# Patient Record
Sex: Female | Born: 1950 | Race: White | Hispanic: No | Marital: Single | Smoking: Never smoker
Health system: Southern US, Community
[De-identification: ages and names within clinical notes are randomized; demographics above are authoritative.]

## PROBLEM LIST (undated history)

## (undated) DIAGNOSIS — H269 Unspecified cataract: Secondary | ICD-10-CM

## (undated) DIAGNOSIS — F419 Anxiety disorder, unspecified: Secondary | ICD-10-CM

## (undated) DIAGNOSIS — M8580 Other specified disorders of bone density and structure, unspecified site: Secondary | ICD-10-CM

## (undated) DIAGNOSIS — J45909 Unspecified asthma, uncomplicated: Secondary | ICD-10-CM

## (undated) DIAGNOSIS — T7840XA Allergy, unspecified, initial encounter: Secondary | ICD-10-CM

## (undated) HISTORY — DX: Unspecified cataract: H26.9

## (undated) HISTORY — DX: Allergy, unspecified, initial encounter: T78.40XA

## (undated) HISTORY — PX: ABDOMINAL HYSTERECTOMY: SHX81

## (undated) HISTORY — PX: TUBAL LIGATION: SHX77

## (undated) HISTORY — DX: Other specified disorders of bone density and structure, unspecified site: M85.80

## (undated) HISTORY — PX: BLADDER SURGERY: SHX569

---

## 1997-06-25 ENCOUNTER — Other Ambulatory Visit: Admission: RE | Admit: 1997-06-25 | Discharge: 1997-06-25 | Payer: Self-pay | Admitting: Obstetrics and Gynecology

## 1998-07-18 ENCOUNTER — Other Ambulatory Visit: Admission: RE | Admit: 1998-07-18 | Discharge: 1998-07-18 | Payer: Self-pay | Admitting: Obstetrics and Gynecology

## 1998-07-26 ENCOUNTER — Other Ambulatory Visit: Admission: RE | Admit: 1998-07-26 | Discharge: 1998-07-26 | Payer: Self-pay | Admitting: Gastroenterology

## 1999-07-19 ENCOUNTER — Other Ambulatory Visit: Admission: RE | Admit: 1999-07-19 | Discharge: 1999-07-19 | Payer: Self-pay | Admitting: Obstetrics and Gynecology

## 1999-10-04 ENCOUNTER — Observation Stay (HOSPITAL_COMMUNITY): Admission: RE | Admit: 1999-10-04 | Discharge: 1999-10-05 | Payer: Self-pay | Admitting: Urology

## 2000-07-30 ENCOUNTER — Other Ambulatory Visit: Admission: RE | Admit: 2000-07-30 | Discharge: 2000-07-30 | Payer: Self-pay | Admitting: Obstetrics and Gynecology

## 2002-05-07 ENCOUNTER — Other Ambulatory Visit (HOSPITAL_COMMUNITY): Admission: RE | Admit: 2002-05-07 | Discharge: 2002-05-21 | Payer: Self-pay | Admitting: Psychiatry

## 2004-11-07 ENCOUNTER — Emergency Department (HOSPITAL_COMMUNITY): Admission: EM | Admit: 2004-11-07 | Discharge: 2004-11-08 | Payer: Self-pay | Admitting: Emergency Medicine

## 2007-02-10 ENCOUNTER — Ambulatory Visit: Payer: Self-pay | Admitting: Internal Medicine

## 2007-02-21 ENCOUNTER — Ambulatory Visit: Payer: Self-pay | Admitting: Internal Medicine

## 2008-05-31 ENCOUNTER — Encounter: Admission: RE | Admit: 2008-05-31 | Discharge: 2008-05-31 | Payer: Self-pay | Admitting: Family Medicine

## 2009-01-26 ENCOUNTER — Encounter: Admission: RE | Admit: 2009-01-26 | Discharge: 2009-01-26 | Payer: Self-pay | Admitting: Unknown Physician Specialty

## 2010-04-04 IMAGING — CT CT PELVIS W/O CM
2 of 4 series · 17 of 46 positions shown, 19 images · non-contrast
Comparison: None.

CT ABDOMEN

CLINICAL DATA: Right lower quadrant pain and hematuria.  Bladder
surgery 10 years ago.

CT OF THE ABDOMEN AND PELVIS WITHOUT CONTRAST (CT UROGRAM)
TECHNIQUE: Multidetector CT imaging was performed through the
abdomen and pelvis to include the urinary tract.

[Series 2: wo · axial · 0.68mm/px · z∈[+1117,+1502]mm · 14 of 85 slices shown, 16 images]
[im 4/85  soft-tissue]
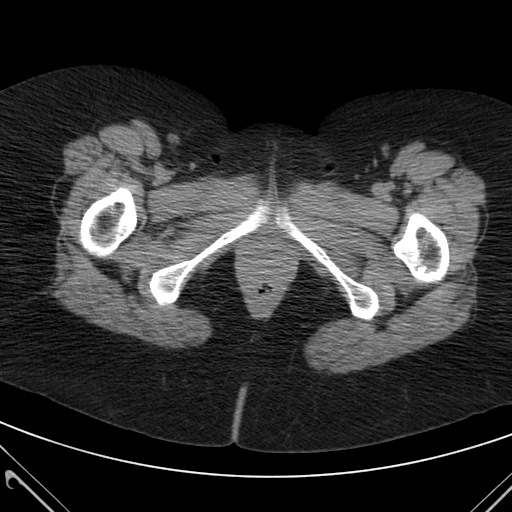
[im 4/85  bone]
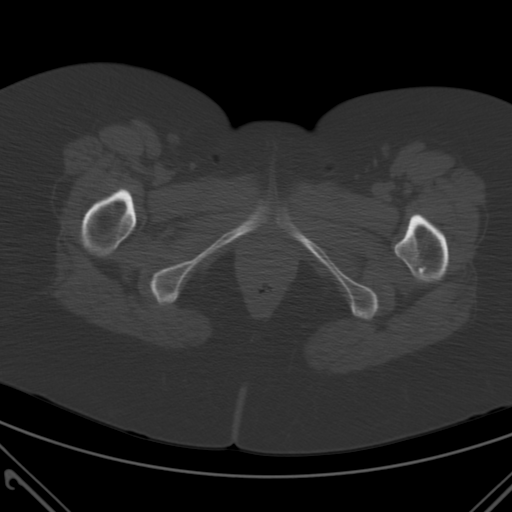
[im 11/85  soft-tissue]
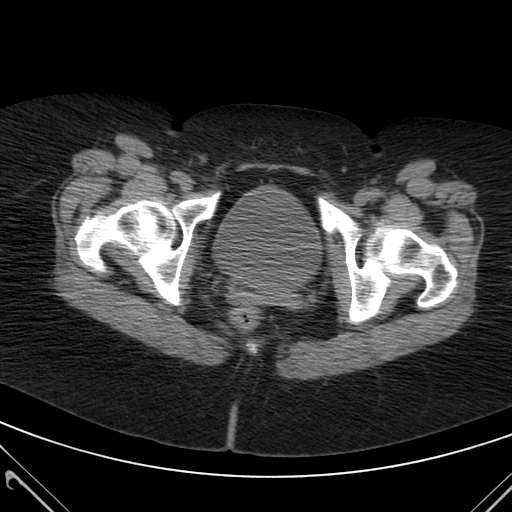
[im 18/85  soft-tissue]
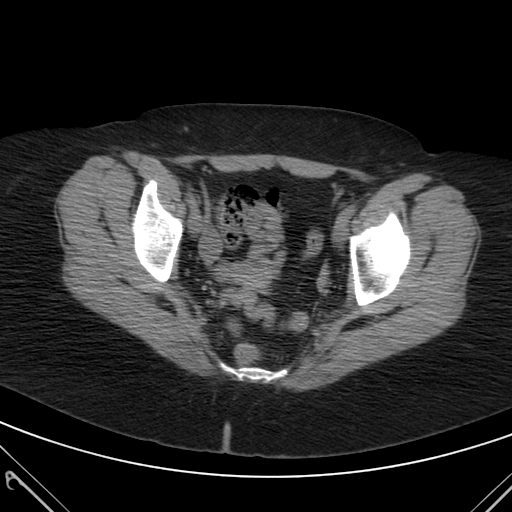
[im 22/85  soft-tissue]
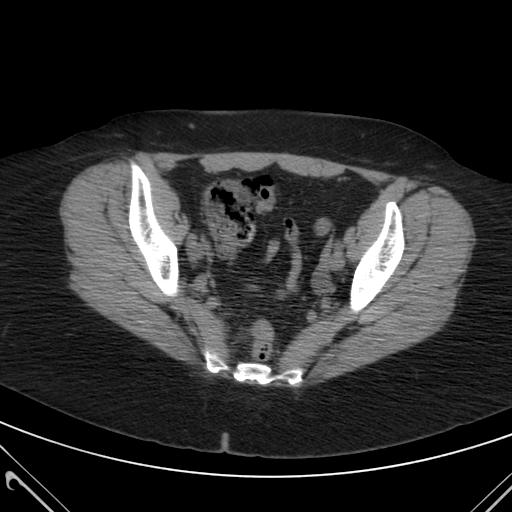
[im 29/85  soft-tissue]
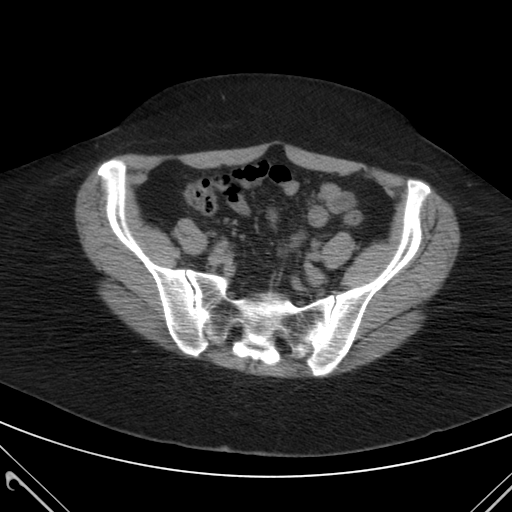
[im 36/85  soft-tissue]
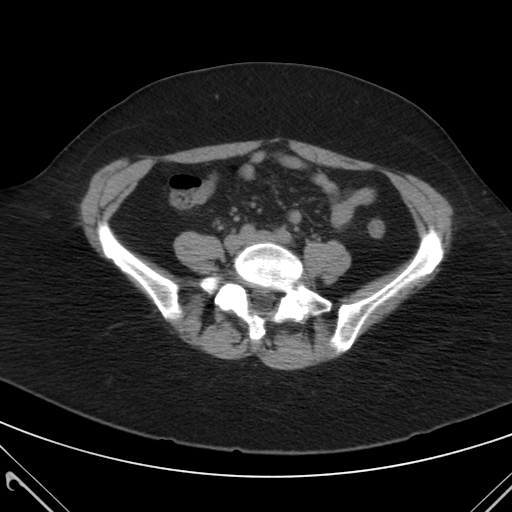
[im 39/85  soft-tissue]
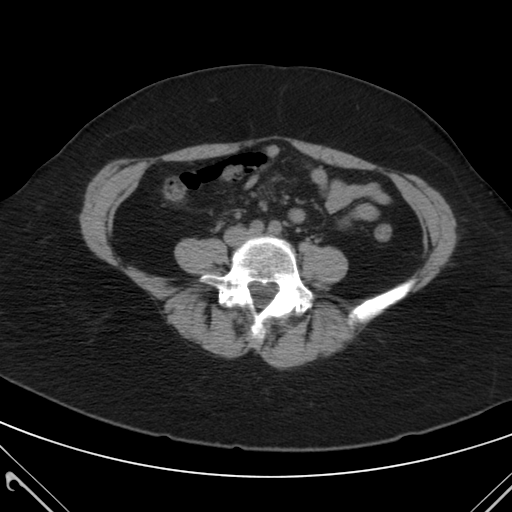
[im 46/85  soft-tissue]
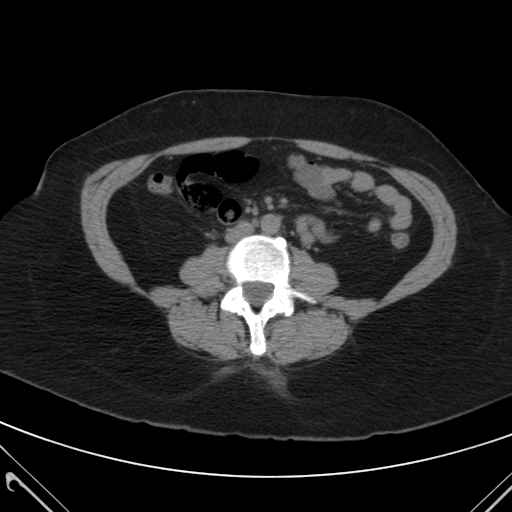
[im 50/85  soft-tissue]
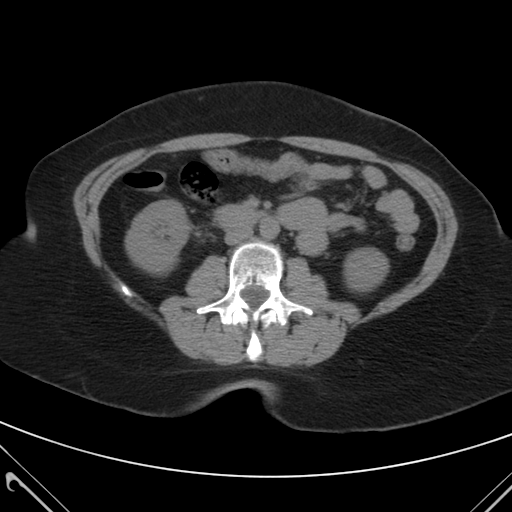
[im 50/85  bone]
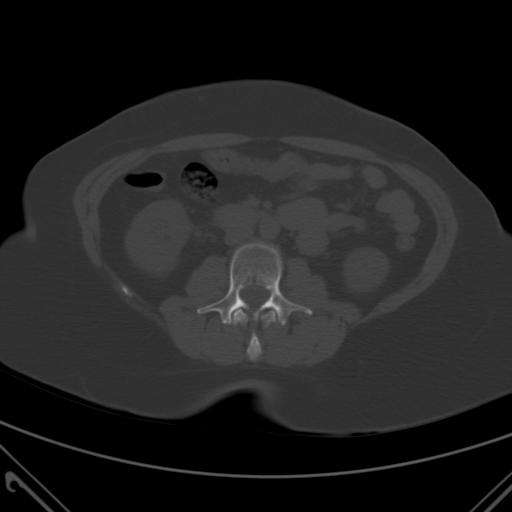
[im 57/85  soft-tissue]
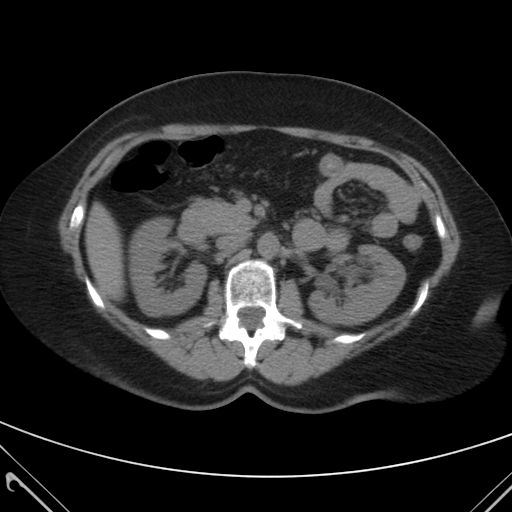
[im 64/85  soft-tissue]
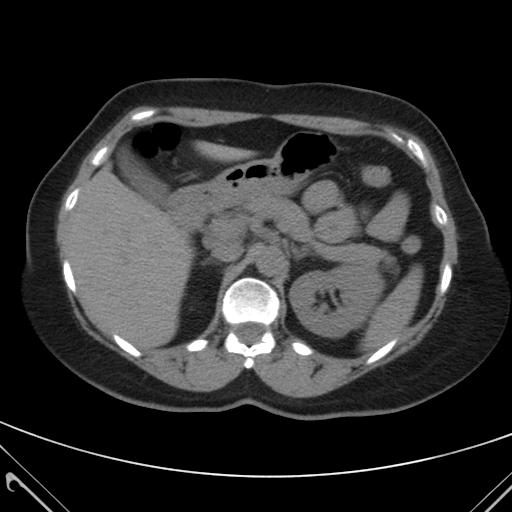
[im 67/85  soft-tissue]
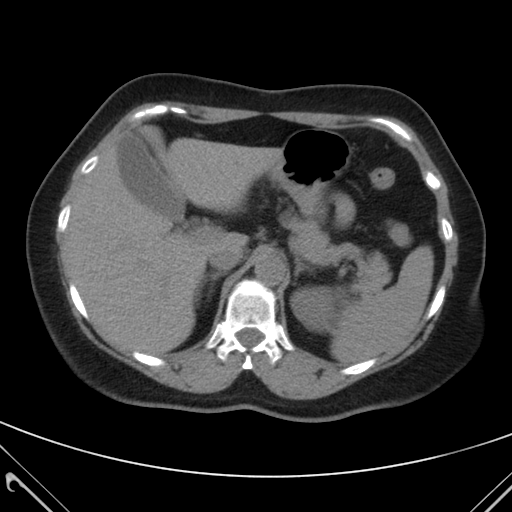
[im 74/85  soft-tissue]
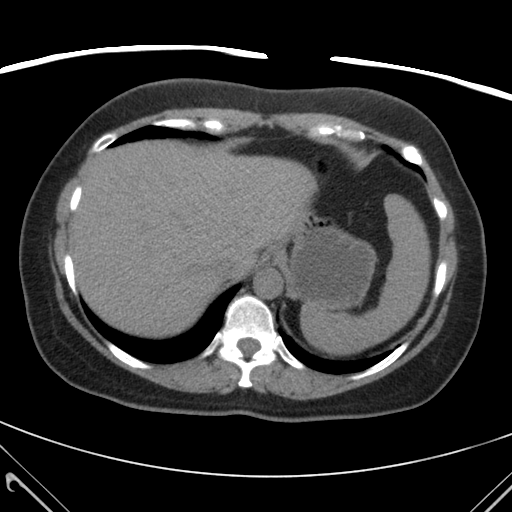
[im 81/85  soft-tissue]
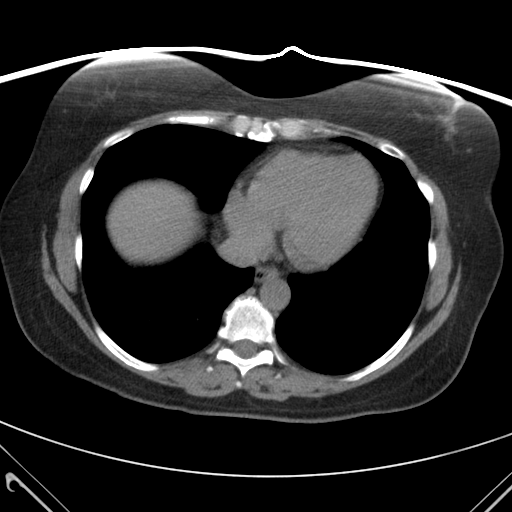

[coronals · coronal · 0.82mm/px · 3 of 83 slices shown]
[im 28/83  soft-tissue]
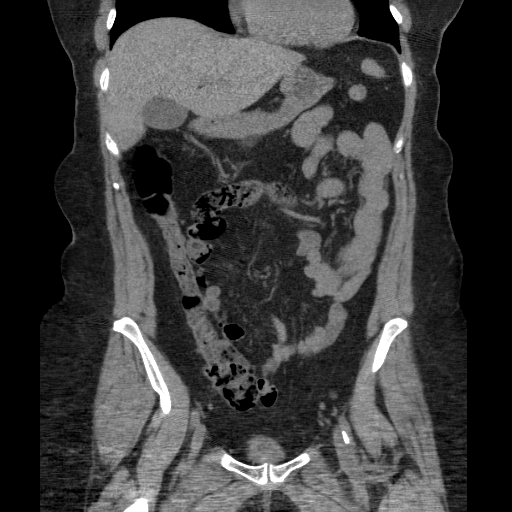
[im 37/83  soft-tissue]
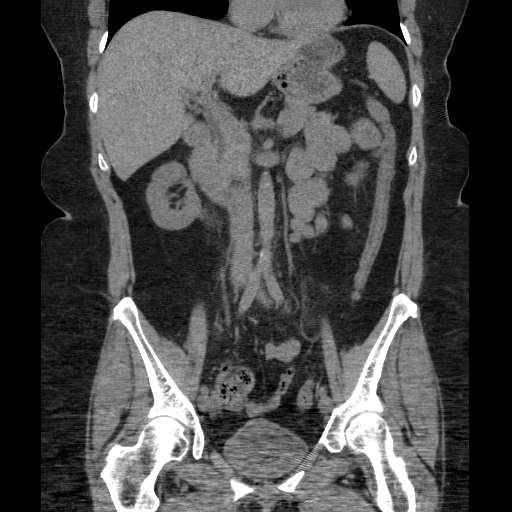
[im 46/83  soft-tissue]
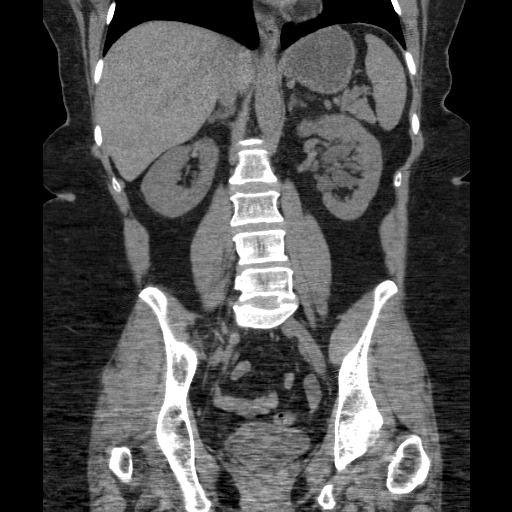

[17 of 46 positions shown; findings below may reference images not displayed]

FINDINGS: Multiple left parapelvic renal cysts.  No renal or
ureteral calculi and no hydronephrosis.  Mild soft tissue stranding
around the proximal right ureter.  Unremarkable liver, spleen,
pancreas, gallbladder and adrenal glands.  No gastrointestinal
abnormalities or enlarged nodes.  Minimal scarring at the left lung
base.  Mild lumbar and lower thoracic spine degenerative changes.
IMPRESSION: No acute abnormality.

CT PELVIS
FINDINGS: Small right internal iliac artery atheromatous
calcification.  Left pelvic phleboliths.  Tiny calcification at the
posterior aspect of the urinary bladder on the left.  Surgically
absent uterus.  Normal appearing ovaries.  Unremarkable pelvic
bones.
IMPRESSION: Tiny calculus in the urinary bladder on the left, most likely
representing a recently passed right ureteral calculus.

## 2010-06-30 NOTE — H&P (Signed)
[facility]  Patient:    Brianna Daniel, Brianna Daniel             MRN: [id number] Adm. Date:  [id number] Attending:  Lauree Chandler CC:         Rande Brunt. Eda Paschal, M.D.   History and Physical  HISTORY OF PRESENT ILLNESS:  This 60 year old white female has had a two-year history of classic stress incontinence that occurs when she sneezes, runs, coughs, or exercises.  She has no urgency incontinence.  She has some frequency and has nocturia x 1.  She wears pads to exercise.  She is very inconvenienced by her symptoms.  She was worked up with urodynamics and scheduled for a pubovaginal sling.  She has had an abdominal hysterectomy in the past for irregular menstrual bleeding.  PAST MEDICAL HISTORY:  She is in general good health.  She had a tubal ligation before her hysterectomy and also has had a breast biopsy.  She has esophageal reflux.  MEDICATIONS:  Premarin 0.625 mg a day.  ALLERGIES:  PENICILLIN (severe).  SOCIAL HISTORY:  Occasional alcohol but no tobacco utilization.  FAMILY HISTORY:  Noncontributory.  REVIEW OF SYSTEMS:  As noted on her health history form.  PHYSICAL EXAMINATION:  GENERAL:  She is alert and oriented.  Skin is warm and dry.  She is in no acute distress, no respiratory distress.  ABDOMEN:  Soft, nontender.  GENITOURINARY:  External genitalia normal.  She has a mild cystocele, no rectocele.  Uterus and cervix are absent.  EXTREMITIES:  No peripheral edema.  IMPRESSION: 1. Stress urinary incontinence. 2. Gastroesophageal reflux disease. DD:  10/04/99 TD:  10/04/99 Job: [id number]

## 2010-06-30 NOTE — Op Note (Signed)
[facility]  Patient:    Brianna Daniel, Brianna Daniel             MRN: [id number] Proc. Date: 10/04/99 Adm. Date:  [id number] Disc. Date: [id number] Attending:  Lauree Chandler CC:         Rande Brunt. Eda Paschal, M.D.   Operative Report  PREOPERATIVE DIAGNOSIS:  Stress urinary incontinence.  POSTOPERATIVE DIAGNOSIS:  Stress urinary incontinence.  PROCEDURE:  Pubovaginal sling.  SURGEON:  Maretta Bees. Vonita Moss, M.D.  ANESTHESIA:  General.  INDICATIONS:  This 60 year old lady has had classic stress incontinence for over two years and is brought to the operating room for correction of this symptom and problem.  DESCRIPTION OF PROCEDURE:  The patient is brought to the operating room and placed in lithotomy position.  The lower abdomen, external genitalia, and vaginal canal prepped and draped in the usual fashion.  Foley catheter was inserted into the bladder.  Xylocaine with epinephrine was injected suburethrally in the vaginal mucosa.  A midline vaginal incision was made below the urethra and dissected laterally both sides to gain access to the endopelvic fascia.  Bilateral suprapubic incisions were made on each side of the midline and dissected down to fascia.  Then the Struhle scissors were used perforate the endopelvic fascia on each side away from the bladder neck.  A donor fascia lata graft which had been soaked in triple antibiotic solution was folded over in half for a width of about 1.5 cm and length of approximately 8 cm, had #1 Prolene sewn on each end, and then using the Raz needle-passer, the rectus fascia was perforated on each side, and the needle was brought through the endopelvic fascia and exposed periurethrally and the Prolene is loaded on the needles and brought back up through the wound to allow the donor fascia graft to lie in good position suburethrally.  The graft was sewn in place proximally and distally with 3-0 Vicryl.  The wound  was irrigated with triple antibiotic solution.  The vaginal mucosa was closed with running 2-0 Vicryl.  The Foley catheter was then removed, and she was cystoscoped, and there was no evidence of any bladder injury or perforation, and the tugging on the Prolene showed the sutures were placed right next to the bladder neck on each side and distal to the ureteral orifices.  With the bladder full and the patient in Trendelenburg, a Microvasive suprapubic tube was passed through a stab wound in the midline and brought through the air bubble on the anterior wall of the bladder.  A full coil was placed in the suprapubic tube, and the suprapubic tube was sewn in place to the skin with black silk.  The Prolene sutures were attached to the graft and were tied down at skin level over scissors to avoid excess tension.  Subcutaneous wounds were irrigated with triple antibiotic solution.  The suprapubic incisions were closed with skin staples.  The vaginal canal was packed with Neosporin soaked vaginal packing.  She was taken to the recovery room in good condition. Sponge, needle and instrument counts were correct.  Estimated blood loss was 100 cc.  She tolerated the procedure well. DD:  10/04/99 TD:  10/04/99 Job: [id number]

## 2012-02-21 ENCOUNTER — Encounter: Payer: Self-pay | Admitting: Internal Medicine

## 2012-07-20 ENCOUNTER — Ambulatory Visit (INDEPENDENT_AMBULATORY_CARE_PROVIDER_SITE_OTHER): Payer: BC Managed Care – PPO | Admitting: Emergency Medicine

## 2012-07-20 VITALS — BP 140/84 | HR 82 | Temp 98.0°F | Resp 17 | Ht 61.5 in | Wt 178.0 lb

## 2012-07-20 DIAGNOSIS — R300 Dysuria: Secondary | ICD-10-CM

## 2012-07-20 DIAGNOSIS — R42 Dizziness and giddiness: Secondary | ICD-10-CM

## 2012-07-20 LAB — POCT UA - MICROSCOPIC ONLY
Bacteria, U Microscopic: NEGATIVE
Casts, Ur, LPF, POC: NEGATIVE
Crystals, Ur, HPF, POC: NEGATIVE
Mucus, UA: NEGATIVE
RBC, urine, microscopic: NEGATIVE
Renal tubular cells: POSITIVE
Yeast, UA: NEGATIVE

## 2012-07-20 LAB — POCT URINALYSIS DIPSTICK
Bilirubin, UA: NEGATIVE
Blood, UA: NEGATIVE
Glucose, UA: NEGATIVE
Ketones, UA: NEGATIVE
Nitrite, UA: NEGATIVE
Protein, UA: NEGATIVE
Spec Grav, UA: 1.01
Urobilinogen, UA: 0.2
pH, UA: 5.5

## 2012-07-20 LAB — POCT CBC
Granulocyte percent: 49.9 %G (ref 37–80)
MID (cbc): 0.3 (ref 0–0.9)
MPV: 10.6 fL (ref 0–99.8)
POC Granulocyte: 2.3 (ref 2–6.9)
POC MID %: 6.8 %M (ref 0–12)
Platelet Count, POC: 177 10*3/uL (ref 142–424)
RBC: 4.76 M/uL (ref 4.04–5.48)

## 2012-07-20 LAB — GLUCOSE, POCT (MANUAL RESULT ENTRY): POC Glucose: 89 mg/dl (ref 70–99)

## 2012-07-20 MED ORDER — MECLIZINE HCL 25 MG PO TABS: ORAL_TABLET | ORAL | Status: DC

## 2012-07-20 NOTE — Patient Instructions (Addendum)
Vertigo Vertigo means you feel like you or your surroundings are moving when they are not. Vertigo can be dangerous if it occurs when you are at work, driving, or performing difficult activities.  CAUSES  Vertigo occurs when there is a conflict of signals sent to your brain from the visual and sensory systems in your body. There are many different causes of vertigo, including:  Infections, especially in the inner ear.  A bad reaction to a drug or misuse of alcohol and medicines.  Withdrawal from drugs or alcohol.  Rapidly changing positions, such as lying down or rolling over in bed.  A migraine headache.  Decreased blood flow to the brain.  Increased pressure in the brain from a head injury, infection, tumor, or bleeding. SYMPTOMS  You may feel as though the world is spinning around or you are falling to the ground. Because your balance is upset, vertigo can cause nausea and vomiting. You may have involuntary eye movements (nystagmus). DIAGNOSIS  Vertigo is usually diagnosed by physical exam. If the cause of your vertigo is unknown, your caregiver may perform imaging tests, such as an MRI scan (magnetic resonance imaging). TREATMENT  Most cases of vertigo resolve on their own, without treatment. Depending on the cause, your caregiver may prescribe certain medicines. If your vertigo is related to body position issues, your caregiver may recommend movements or procedures to correct the problem. In rare cases, if your vertigo is caused by certain inner ear problems, you may need surgery. HOME CARE INSTRUCTIONS   Follow your caregiver's instructions.  Avoid driving.  Avoid operating heavy machinery.  Avoid performing any tasks that would be dangerous to you or others during a vertigo episode.  Tell your caregiver if you notice that certain medicines seem to be causing your vertigo. Some of the medicines used to treat vertigo episodes can actually make them worse in some people. SEEK  IMMEDIATE MEDICAL CARE IF:   Your medicines do not relieve your vertigo or are making it worse.  You develop problems with talking, walking, weakness, or using your arms, hands, or legs.  You develop severe headaches.  Your nausea or vomiting continues or gets worse.  You develop visual changes.  A family member notices behavioral changes.  Your condition gets worse. MAKE SURE YOU:  Understand these instructions.  Will watch your condition.  Will get help right away if you are not doing well or get worse. Document Released: 11/08/2004 Document Revised: 04/23/2011 Document Reviewed: 08/17/2010 [employer] Patient Information 2014 [employer].  

## 2012-07-20 NOTE — Progress Notes (Signed)
Subjective:    Patient ID: Brianna Daniel, female    DOB: [birth year], 62 y.o.   MRN: [id number]  HPI   Pt woke up yesterday at 4.30 am feeling a spinning sensation, feel better today no nausea. "Gland feels swollen". She continues to have a sensation of spinning when she goes from lying to sitting. She says this is very transient and quickly resolves. She has no previous history of ear problems or in her ear. She has had no difficulty with tinnitus or hearing loss. She has no other neurological symptoms of extremity weakness or numbness. She has no headache.  Urine is cloudy no dysuria. She just feels like her urine is dark but no other symptoms. Third problem is pain and discomfort in the left side of her neck. She feels swollen in this area.     Review of Systems     Objective:   Physical Exam patient is alert and cooperative she does not appear in any distress. TMs are normal. The neck is supple. There is mild discomfort along the proximal sternocleidomastoid. There no other masses palpable. There are no carotid bruits heard. Chest is clear to auscultation and percussion. Cardiac regular rate without murmurs. Abdomen is obese without tenderness. There are no focal neurological signs. Cranial nerves II through XII are intact motor strength is symmetrical deep tendon reflexes upper and lower extremities are 2+ and symmetrical the.  Results for orders placed in visit on 07/20/12  POCT UA - MICROSCOPIC ONLY      Result Value Range   WBC, Ur, HPF, POC 1-4     RBC, urine, microscopic NEG     Bacteria, U Microscopic NEG     Mucus, UA NEG     Epithelial cells, urine per micros 0-2     Crystals, Ur, HPF, POC NEG     Casts, Ur, LPF, POC NEG     Yeast, UA NEG     Renal tubular cells POS    POCT URINALYSIS DIPSTICK      Result Value Range   Color, UA YELLOW     Clarity, UA CLEAR     Glucose, UA NEG     Bilirubin, UA NEG     Ketones, UA NEG     Spec Grav, UA 1.010     Blood, UA NEG     pH, UA 5.5     Protein, UA NEG     Urobilinogen, UA 0.2     Nitrite, UA NEG     Leukocytes, UA small (1+)    POCT CBC      Result Value Range   WBC 4.7  4.6 - 10.2 K/uL   Lymph, poc 2.0  0.6 - 3.4   POC LYMPH PERCENT 43.3  10 - 50 %L   MID (cbc) 0.3  0 - 0.9   POC MID % 6.8  0 - 12 %M   POC Granulocyte 2.3  2 - 6.9   Granulocyte percent 49.9  37 - 80 %G   RBC 4.76  4.04 - 5.48 M/uL   Hemoglobin 13.8  12.2 - 16.2 g/dL   HCT, POC 86.5  78.4 - 47.9 %   MCV 91.3  80 - 97 fL   MCH, POC 29.0  27 - 31.2 pg   MCHC 31.7 (*) 31.8 - 35.4 g/dL   RDW, POC 69.6     Platelet Count, POC 177  142 - 424 K/uL   MPV 10.6  0 - 99.8 fL  GLUCOSE, POCT (MANUAL RESULT ENTRY)      Result Value Range   POC Glucose 89  70 - 99 mg/dl        Assessment & Plan:  Check CBC and glucose. We'll orthostatics were good. She's currently on Celexa and Lipitor. Her history and exam are most consistent with labyrinthitis. Urine was mildly abnormal go ahead and check culture. We'll treat with meclizine 25 mg one half to one 3 times a day for in her ear symptoms. Recheck if worsening.

## 2012-07-21 LAB — URINE CULTURE
Colony Count: NO GROWTH
Organism ID, Bacteria: NO GROWTH

## 2012-10-08 ENCOUNTER — Encounter: Payer: Self-pay | Admitting: Internal Medicine

## 2013-08-18 ENCOUNTER — Encounter (HOSPITAL_BASED_OUTPATIENT_CLINIC_OR_DEPARTMENT_OTHER): Payer: Self-pay | Admitting: *Deleted

## 2013-08-21 ENCOUNTER — Encounter (HOSPITAL_BASED_OUTPATIENT_CLINIC_OR_DEPARTMENT_OTHER): Payer: 59 | Admitting: Certified Registered"

## 2013-08-21 ENCOUNTER — Ambulatory Visit (HOSPITAL_BASED_OUTPATIENT_CLINIC_OR_DEPARTMENT_OTHER)
Admission: RE | Admit: 2013-08-21 | Discharge: 2013-08-21 | Disposition: A | Discharge status: Home / Self Care | Payer: 59 | Source: Ambulatory Visit | Attending: Orthopedic Surgery | Admitting: Orthopedic Surgery

## 2013-08-21 ENCOUNTER — Ambulatory Visit (HOSPITAL_BASED_OUTPATIENT_CLINIC_OR_DEPARTMENT_OTHER): Payer: 59 | Admitting: Certified Registered"

## 2013-08-21 ENCOUNTER — Encounter (HOSPITAL_BASED_OUTPATIENT_CLINIC_OR_DEPARTMENT_OTHER)
Admission: RE | Disposition: A | Discharge status: Home / Self Care | Payer: Self-pay | Source: Ambulatory Visit | Attending: Orthopedic Surgery

## 2013-08-21 ENCOUNTER — Encounter (HOSPITAL_BASED_OUTPATIENT_CLINIC_OR_DEPARTMENT_OTHER): Payer: Self-pay | Admitting: *Deleted

## 2013-08-21 DIAGNOSIS — J45909 Unspecified asthma, uncomplicated: Secondary | ICD-10-CM | POA: Insufficient documentation

## 2013-08-21 DIAGNOSIS — Z880 Allergy status to penicillin: Secondary | ICD-10-CM | POA: Insufficient documentation

## 2013-08-21 DIAGNOSIS — F411 Generalized anxiety disorder: Secondary | ICD-10-CM | POA: Insufficient documentation

## 2013-08-21 DIAGNOSIS — M7130 Other bursal cyst, unspecified site: Secondary | ICD-10-CM | POA: Insufficient documentation

## 2013-08-21 DIAGNOSIS — M949 Disorder of cartilage, unspecified: Secondary | ICD-10-CM

## 2013-08-21 DIAGNOSIS — M19039 Primary osteoarthritis, unspecified wrist: Secondary | ICD-10-CM | POA: Insufficient documentation

## 2013-08-21 DIAGNOSIS — M899 Disorder of bone, unspecified: Secondary | ICD-10-CM | POA: Insufficient documentation

## 2013-08-21 HISTORY — DX: Unspecified asthma, uncomplicated: J45.909

## 2013-08-21 HISTORY — PX: MASS EXCISION: SHX2000

## 2013-08-21 HISTORY — DX: Anxiety disorder, unspecified: F41.9

## 2013-08-21 HISTORY — PX: SYNOVECTOMY: SHX5180

## 2013-08-21 LAB — POCT HEMOGLOBIN-HEMACUE: HEMOGLOBIN: 13.3 g/dL (ref 12.0–15.0)

## 2013-08-21 SURGERY — EXCISION MASS
Anesthesia: General | Site: Wrist | Laterality: Right

## 2013-08-21 MED ORDER — LIDOCAINE HCL (PF) 1 % IJ SOLN
INTRAMUSCULAR | Status: AC
  Filled 2013-08-21: qty 30

## 2013-08-21 MED ORDER — HYDROMORPHONE HCL PF 1 MG/ML IJ SOLN: 0.2500 mg | INTRAMUSCULAR | Status: DC | PRN

## 2013-08-21 MED ORDER — LACTATED RINGERS IV SOLN
INTRAVENOUS | Status: DC
Start: 2013-08-21 — End: 2013-08-21
  Administered 2013-08-21 (×2): via INTRAVENOUS

## 2013-08-21 MED ORDER — MIDAZOLAM HCL 2 MG/2ML IJ SOLN: 1.0000 mg | INTRAMUSCULAR | Status: DC | PRN

## 2013-08-21 MED ORDER — PROMETHAZINE HCL 25 MG/ML IJ SOLN: 6.2500 mg | INTRAMUSCULAR | Status: DC | PRN

## 2013-08-21 MED ORDER — SODIUM BICARBONATE 4 % IV SOLN
INTRAVENOUS | Status: AC
  Filled 2013-08-21: qty 5

## 2013-08-21 MED ORDER — ONDANSETRON HCL 4 MG/2ML IJ SOLN
INTRAMUSCULAR | Status: DC | PRN
  Administered 2013-08-21: 4 mg via INTRAVENOUS

## 2013-08-21 MED ORDER — HYDROCODONE-ACETAMINOPHEN 5-325 MG PO TABS: 2.0000 | ORAL_TABLET | Freq: Four times a day (QID) | ORAL | Status: DC | PRN

## 2013-08-21 MED ORDER — FENTANYL CITRATE 0.05 MG/ML IJ SOLN
INTRAMUSCULAR | Status: AC
  Filled 2013-08-21: qty 6

## 2013-08-21 MED ORDER — BUPIVACAINE HCL (PF) 0.25 % IJ SOLN
INTRAMUSCULAR | Status: DC | PRN
  Administered 2013-08-21: 10 mL

## 2013-08-21 MED ORDER — EPHEDRINE SULFATE 50 MG/ML IJ SOLN
INTRAMUSCULAR | Status: DC | PRN
  Administered 2013-08-21: 10 mg via INTRAVENOUS

## 2013-08-21 MED ORDER — LIDOCAINE HCL (CARDIAC) 20 MG/ML IV SOLN
INTRAVENOUS | Status: DC | PRN
  Administered 2013-08-21: 60 mg via INTRAVENOUS

## 2013-08-21 MED ORDER — PROPOFOL 10 MG/ML IV BOLUS
INTRAVENOUS | Status: DC | PRN
Start: 2013-08-21 — End: 2013-08-21
  Administered 2013-08-21: 150 mg via INTRAVENOUS

## 2013-08-21 MED ORDER — FENTANYL CITRATE 0.05 MG/ML IJ SOLN
50.0000 ug | INTRAMUSCULAR | Status: DC | PRN
Start: 2013-08-21 — End: 2013-08-21

## 2013-08-21 MED ORDER — OXYCODONE HCL 5 MG/5ML PO SOLN: 5.0000 mg | Freq: Once | ORAL | Status: DC | PRN

## 2013-08-21 MED ORDER — VANCOMYCIN HCL 1000 MG IV SOLR
1000.0000 mg | INTRAVENOUS | Status: DC | PRN
  Administered 2013-08-21: 1000 mg via INTRAVENOUS

## 2013-08-21 MED ORDER — 0.9 % SODIUM CHLORIDE (POUR BTL) OPTIME
TOPICAL | Status: DC | PRN
  Administered 2013-08-21: 200 mL

## 2013-08-21 MED ORDER — FENTANYL CITRATE 0.05 MG/ML IJ SOLN
INTRAMUSCULAR | Status: DC | PRN
  Administered 2013-08-21: 25 ug via INTRAVENOUS
  Administered 2013-08-21: 50 ug via INTRAVENOUS
  Administered 2013-08-21: 25 ug via INTRAVENOUS

## 2013-08-21 MED ORDER — OXYCODONE HCL 5 MG PO TABS: 5.0000 mg | ORAL_TABLET | Freq: Once | ORAL | Status: DC | PRN

## 2013-08-21 MED ORDER — VANCOMYCIN HCL IN DEXTROSE 1-5 GM/200ML-% IV SOLN
INTRAVENOUS | Status: AC
  Filled 2013-08-21: qty 200

## 2013-08-21 MED ORDER — DEXAMETHASONE SODIUM PHOSPHATE 10 MG/ML IJ SOLN
INTRAMUSCULAR | Status: DC | PRN
  Administered 2013-08-21: 10 mg via INTRAVENOUS

## 2013-08-21 MED ORDER — MIDAZOLAM HCL 5 MG/5ML IJ SOLN
INTRAMUSCULAR | Status: DC | PRN
  Administered 2013-08-21: 1 mg via INTRAVENOUS

## 2013-08-21 MED ORDER — BUPIVACAINE HCL (PF) 0.5 % IJ SOLN
INTRAMUSCULAR | Status: AC
  Filled 2013-08-21: qty 30

## 2013-08-21 MED ORDER — MIDAZOLAM HCL 2 MG/2ML IJ SOLN
INTRAMUSCULAR | Status: AC
  Filled 2013-08-21: qty 2

## 2013-08-21 MED ORDER — BUPIVACAINE HCL (PF) 0.25 % IJ SOLN
INTRAMUSCULAR | Status: AC
  Filled 2013-08-21: qty 30

## 2013-08-21 MED ORDER — PROPOFOL 10 MG/ML IV EMUL
INTRAVENOUS | Status: AC
  Filled 2013-08-21: qty 100

## 2013-08-21 SURGICAL SUPPLY — 63 items
BANDAGE COBAN STERILE 2 (GAUZE/BANDAGES/DRESSINGS) IMPLANT
BANDAGE ELASTIC 3 VELCRO ST LF (GAUZE/BANDAGES/DRESSINGS) ×3 IMPLANT
BLADE SURG 15 STRL LF DISP TIS (BLADE) ×2 IMPLANT
BLADE SURG 15 STRL SS (BLADE) ×4
BNDG COHESIVE 1X5 TAN STRL LF (GAUZE/BANDAGES/DRESSINGS) IMPLANT
BNDG COHESIVE 3X5 TAN STRL LF (GAUZE/BANDAGES/DRESSINGS) IMPLANT
BNDG CONFORM 3 STRL LF (GAUZE/BANDAGES/DRESSINGS) ×3 IMPLANT
BNDG GAUZE ELAST 4 BULKY (GAUZE/BANDAGES/DRESSINGS) IMPLANT
BRUSH SCRUB EZ PLAIN DRY (MISCELLANEOUS) ×3 IMPLANT
CLOSURE WOUND 1/2 X4 (GAUZE/BANDAGES/DRESSINGS)
CORDS BIPOLAR (ELECTRODE) ×3 IMPLANT
COVER MAYO STAND STRL (DRAPES) ×3 IMPLANT
COVER TABLE BACK 60X90 (DRAPES) ×3 IMPLANT
CUFF TOURNIQUET SINGLE 18IN (TOURNIQUET CUFF) ×3 IMPLANT
DECANTER SPIKE VIAL GLASS SM (MISCELLANEOUS) IMPLANT
DRAPE EXTREMITY T 121X128X90 (DRAPE) ×3 IMPLANT
DRAPE SURG 17X23 STRL (DRAPES) ×3 IMPLANT
DRSG EMULSION OIL 3X3 NADH (GAUZE/BANDAGES/DRESSINGS) ×3 IMPLANT
GAUZE SPONGE 4X4 12PLY STRL (GAUZE/BANDAGES/DRESSINGS) ×3 IMPLANT
GAUZE XEROFORM 1X8 LF (GAUZE/BANDAGES/DRESSINGS) ×3 IMPLANT
GLOVE BIO SURGEON STRL SZ 6.5 (GLOVE) ×2 IMPLANT
GLOVE BIO SURGEONS STRL SZ 6.5 (GLOVE) ×1
GLOVE BIOGEL M STRL SZ7.5 (GLOVE) ×3 IMPLANT
GLOVE BIOGEL PI IND STRL 7.0 (GLOVE) ×2 IMPLANT
GLOVE BIOGEL PI INDICATOR 7.0 (GLOVE) ×4
GLOVE ECLIPSE 6.5 STRL STRAW (GLOVE) ×3 IMPLANT
GLOVE EXAM NITRILE LRG STRL (GLOVE) ×3 IMPLANT
GLOVE SS BIOGEL STRL SZ 8 (GLOVE) ×1 IMPLANT
GLOVE SUPERSENSE BIOGEL SZ 8 (GLOVE) ×2
GOWN STRL REUS W/ TWL LRG LVL3 (GOWN DISPOSABLE) ×2 IMPLANT
GOWN STRL REUS W/ TWL XL LVL3 (GOWN DISPOSABLE) ×1 IMPLANT
GOWN STRL REUS W/TWL LRG LVL3 (GOWN DISPOSABLE) ×4
GOWN STRL REUS W/TWL XL LVL3 (GOWN DISPOSABLE) ×2
LOOP VESSEL MAXI BLUE (MISCELLANEOUS) ×3 IMPLANT
NEEDLE HYPO 22GX1.5 SAFETY (NEEDLE) IMPLANT
NEEDLE HYPO 25X1 1.5 SAFETY (NEEDLE) ×3 IMPLANT
NS IRRIG 1000ML POUR BTL (IV SOLUTION) ×3 IMPLANT
PACK BASIN DAY SURGERY FS (CUSTOM PROCEDURE TRAY) ×3 IMPLANT
PAD ALCOHOL SWAB (MISCELLANEOUS) IMPLANT
PAD CAST 3X4 CTTN HI CHSV (CAST SUPPLIES) ×1 IMPLANT
PADDING CAST ABS 3INX4YD NS (CAST SUPPLIES)
PADDING CAST ABS 4INX4YD NS (CAST SUPPLIES)
PADDING CAST ABS COTTON 3X4 (CAST SUPPLIES) IMPLANT
PADDING CAST ABS COTTON 4X4 ST (CAST SUPPLIES) IMPLANT
PADDING CAST COTTON 3X4 STRL (CAST SUPPLIES) ×2
SPLINT FIBERGLASS 3X35 (CAST SUPPLIES) ×3 IMPLANT
SPLINT PLASTER CAST XFAST 3X15 (CAST SUPPLIES) IMPLANT
SPLINT PLASTER CAST XFAST 4X15 (CAST SUPPLIES) IMPLANT
SPLINT PLASTER XTRA FAST SET 4 (CAST SUPPLIES)
SPLINT PLASTER XTRA FASTSET 3X (CAST SUPPLIES)
STOCKINETTE 4X48 STRL (DRAPES) ×3 IMPLANT
STOCKINETTE SYNTHETIC 3 UNSTER (CAST SUPPLIES) IMPLANT
STOCKINETTE SYNTHETIC 4 NONSTR (MISCELLANEOUS) IMPLANT
STRIP CLOSURE SKIN 1/2X4 (GAUZE/BANDAGES/DRESSINGS) IMPLANT
SUT ETHILON 8 0 BV130 4 (SUTURE) ×3 IMPLANT
SUT PROLENE 4 0 PS 2 18 (SUTURE) ×3 IMPLANT
SUT PROLENE 5 0 P 3 (SUTURE) IMPLANT
SUT PROLENE 6 0 P 1 18 (SUTURE) ×3 IMPLANT
SYR BULB 3OZ (MISCELLANEOUS) ×3 IMPLANT
SYRINGE CONTROL L 12CC (SYRINGE) ×3 IMPLANT
TOWEL OR 17X24 6PK STRL BLUE (TOWEL DISPOSABLE) ×6 IMPLANT
TOWEL OR NON WOVEN STRL DISP B (DISPOSABLE) IMPLANT
UNDERPAD 30X30 INCONTINENT (UNDERPADS AND DIAPERS) ×3 IMPLANT

## 2013-08-21 NOTE — Transfer of Care (Signed)
Immediate Anesthesia Transfer of Care Note  Patient: Brianna NorrieBonnie Goller  Procedure(s) Performed: Procedure(s): RIGHT WRIST MASS EXCISION/ARTHROTOMY SYNOVECTOMY WITH JOINT DEBRIDEMENT (Right) SYNOVECTOMY (Right)  Patient Location: PACU  Anesthesia Type:General  Level of Consciousness: awake and patient cooperative  Airway & Oxygen Therapy: Patient Spontanous Breathing and Patient connected to face mask oxygen  Post-op Assessment: Report given to PACU RN and Post -op Vital signs reviewed and stable  Post vital signs: Reviewed and stable  Complications: No apparent anesthesia complications

## 2013-08-21 NOTE — Anesthesia Postprocedure Evaluation (Signed)
Anesthesia Post Note  Patient: Brianna NorrieBonnie Daniel  Procedure(s) Performed: Procedure(s) (LRB): RIGHT WRIST MASS EXCISION/ARTHROTOMY SYNOVECTOMY WITH JOINT DEBRIDEMENT (Right) SYNOVECTOMY (Right)  Anesthesia type: MAC  Patient location: PACU  Post pain: Pain level controlled  Post assessment: Patient's Cardiovascular Status Stable  Last Vitals:  Filed Vitals:   08/21/13 0955  BP: 167/91  Pulse: 86  Temp: 36.7 C  Resp: 16    Post vital signs: Reviewed and stable  Level of consciousness: sedated  Complications: No apparent anesthesia complications

## 2013-08-21 NOTE — Discharge Instructions (Signed)
Keep bandage clean and dry.  Call for any problems.  No smoking.  Criteria for driving a car: you should be off your pain medicine for 7-8 hours, able to drive one handed(confident), thinking clearly and feeling able in your judgement to drive. °Continue elevation as it will decrease swelling.  If instructed by MD move your fingers within the confines of the bandage/splint.  Use ice if instructed by your MD. Call immediately for any sudden loss of feeling in your hand/arm or change in functional abilities of the extremity. ° ° °We recommend that you to take vitamin C 1000 mg a day to promote healing we also recommend that if you require her pain medicine that he take a stool softener to prevent constipation as most pain medicines will have constipation side effects. We recommend either Peri-Colace or Senokot and recommend that you also consider adding MiraLAX to prevent the constipation affects from pain medicine if you are required to use them. These medicines are over the counter and maybe purchased at a local pharmacy. ° ° °Post Anesthesia Home Care Instructions ° °Activity: °Get plenty of rest for the remainder of the day. A responsible adult should stay with you for 24 hours following the procedure.  °For the next 24 hours, DO NOT: °-Drive a car °-Operate machinery °-Drink alcoholic beverages °-Take any medication unless instructed by your physician °-Make any legal decisions or sign important papers. ° °Meals: °Start with liquid foods such as gelatin or soup. Progress to regular foods as tolerated. Avoid greasy, spicy, heavy foods. If nausea and/or vomiting occur, drink only clear liquids until the nausea and/or vomiting subsides. Call your physician if vomiting continues. ° °Special Instructions/Symptoms: °Your throat may feel dry or sore from the anesthesia or the breathing tube placed in your throat during surgery. If this causes discomfort, gargle with warm salt water. The discomfort should disappear  within 24 hours. ° °

## 2013-08-21 NOTE — H&P (Signed)
Brianna Daniel is an 63 y.o. female.   Chief Complaint: right wrist mass HPI: Patient presents for evaluation and treatment of the of their upper extremity predicament. The patient denies neck back chest or of abdominal pain. The patient notes that they have no lower extremity problems. The patient from primarily complains of the upper extremity pain noted.  Plan for right wrist mass excision and repair as necessary  Past Medical History  Diagnosis Date  . Cataract   . Allergy   . Osteopenia   . Anxiety   . Asthma     exercise induced    Past Surgical History  Procedure Laterality Date  . Abdominal hysterectomy    . Tubal ligation    . Bladder surgery      2001    History reviewed. No pertinent family history. Social History:  reports that she has never smoked. She does not have any smokeless tobacco history on file. She reports that she does not drink alcohol or use illicit drugs.  Allergies:  Allergies  Allergen Reactions  . Penicillins Hives    Medications Prior to Admission  Medication Sig Dispense Refill  . simvastatin (ZOCOR) 10 MG tablet Take 10 mg by mouth daily.      . ALBUTEROL IN Inhale into the lungs.      . citalopram (CELEXA) 40 MG tablet Take 40 mg by mouth daily.      . meclizine (ANTIVERT) 25 MG tablet One half to one tablet 3 times a day as needed for dizziness  30 tablet  0    Results for orders placed during the hospital encounter of 08/21/13 (from the past 48 hour(s))  POCT HEMOGLOBIN-HEMACUE     Status: None   Collection Time    08/21/13  6:54 AM      Result Value Ref Range   Hemoglobin 13.3  12.0 - 15.0 g/dL   No results found.  Review of Systems  Constitutional: Negative.   HENT: Negative.   Respiratory: Negative.   Genitourinary: Negative.   Neurological: Negative.   Psychiatric/Behavioral: Negative.     Blood pressure 131/83, pulse 74, temperature 98.4 F (36.9 C), temperature source Oral, resp. rate 16, height 5\' 1"  (1.549 m),  weight 78.926 kg (174 lb), SpO2 95.00%. Physical Exam right wrist mass Intact NV exam Nl F/E fingers and thumb The patient is alert and oriented in no acute distress the patient complains of pain in the affected upper extremity.  The patient is noted to have a normal HEENT exam.  Lung fields show equal chest expansion and no shortness of breath  abdomen exam is nontender without distention.  Lower extremity examination does not show any fracture dislocation or blood clot symptoms.  Pelvis is stable neck and back are stable and nontender  Assessment/Plan We are planning surgery for your upper extremity. The risk and benefits of surgery include risk of bleeding infection anesthesia damage to normal structures and failure of the surgery to accomplish its intended goals of relieving symptoms and restoring function with this in mind we'll going to proceed. I have specifically discussed with the patient the pre-and postoperative regime and the does and don'ts and risk and benefits in great detail. Risk and benefits of surgery also include risk of dystrophy chronic nerve pain failure of the healing process to go onto completion and other inherent risks of surgery The relavent the pathophysiology of the disease/injury process, as well as the alternatives for treatment and postoperative course of action has been  discussed in great detail with the patient who desires to proceed.  We will do everything in our power to help you (the patient) restore function to the upper extremity. Is a pleasure to see this patient today.   Brianna Daniel,Brianna Daniel M 08/21/2013, 7:36 AM

## 2013-08-21 NOTE — Op Note (Signed)
See op [id number] Brianna PeaGramig MD

## 2013-08-21 NOTE — Op Note (Signed)
NAMESUKARI, GRIST             ACCOUNT NO.:  [id number]  MEDICAL RECORD NO.:  [id number]  LOCATION:                               FACILITY:  [facility]  PHYSICIAN:  Dionne Ano. Genni Buske, M.D.DATE OF BIRTH:  1950/12/08  DATE OF PROCEDURE:  08/21/2013 DATE OF DISCHARGE:  08/21/2013                              OPERATIVE REPORT   PREOPERATIVE DIAGNOSIS:  Right volar radial wrist mass, painful in nature.  POSTOPERATIVE DIAGNOSIS:  Right volar radial wrist mass, painful in nature.  PROCEDURE: 1. Right deep greater than 1.5 cm mass removal, right volar radial     wrist. 2. Arthrotomy, synovectomy, right wrist joint. 3. Extensive radial artery dissection and mobilization, right wrist.  SURGEON:  Dionne Ano. Amanda Pea, M.D.  ASSISTANT:  None.  COMPLICATIONS:  None.  ANESTHESIA:  General.  TOURNIQUET TIME:  Less than an hour.  INDICATIONS:  The patient has a painful volar radial wrist mass.  She is 63 years of age.  She has some pre-existing arthritis, but denies significant arthritic pain.  Due to this and the findings, I have recommended surgical algorithm of care in the form of mass excision and synovectomy, arthrotomy.  Risks and benefits, timeframe, duration of recovery etc. were discussed with the patient.  With this in mind, she desires to proceed.  All questions have been encouraged and answered preoperatively.  OPERATIVE PROCEDURE:  The patient was seen by myself and Anesthesia, taken to the operative arena, underwent smooth induction of general LMA anesthesia.  Preoperative vancomycin was given.  Time-out was called. Once this was done, the arm was prepped and draped in usual sterile fashion.  Following this, tourniquet was insufflated.  A sterile field had been secured and a volar radial longitudinal incision was made. Dissection was carried down and the radial artery was identified. Vessel loop was placed around the radial artery and the radial artery was then carefully  mobilized.  The radial artery was densely adherent to the wall of the mass.  We very carefully sculpted the artery off the mass.  The patient did have one area where the wall thickness was quite thin and thus I performed a repair of the wall with a small nylon suture of the 8.0 variety.  The patient tolerated this quite well.  This did leave the radial artery patent as I deflated the tourniquet to check this.  The most difficult aspect of the case of course was simply the radial artery dissection as it was densely adherent to the wall.  The patient had a normal Allen test, and had flow through the ulnar artery is demonstrated with occlusion of the radial artery; however, I wanted to keep the radial artery intact.  The superficial branch of the radial artery was mobilized.  The patient had vessel loop placed about the artery and following this, greater than 1.5 cm mass was excised.  The patient had excised without difficulty and sent for specimen.  The wall was very impressively thickened and the stalk originated from the radiocarpal joint.  At this time, I made an arthrotomy in the radiocarpal region, I dissected down and performed a synovectomy arthrotomy of the radiocarpal joint without difficulty to try to prevent  recurrence of the cyst. Following this, copious irrigation was applied.  I then deflated the tourniquet.  The radial artery was patent.  I allowed time to make sure hemostasis was adequate and it was.  I was pleased with patency of the radial artery.  Refill to the fingers was brisk and there were no complications.  I then irrigated copiously, placed 10 mL Sensorcaine one for postop analgesia with a 25-gauge needle, and closed the wound with interrupted Prolene.  Adaptic, Xeroform, and a sterile dressing was applied.  There were no complicating features.  I will see her back in the office in 10-12 days.  I wanted to keep her still for 3 weeks to prevent recurrence.  I have  discussed the relevant issues, do's and don'ts.  Should any problems arise, she will notify me. I will see her back in the office in 12 days for followup.  These notes have been discussed.  Discharge medicines will be Norco p.r.n. pain.     Dionne AnoWilliam M. Amanda PeaGramig, M.D.     [facility]  D:  08/21/2013  T:  08/21/2013  Job:  [id number]

## 2013-08-21 NOTE — Anesthesia Procedure Notes (Signed)
Procedure Name: LMA Insertion Date/Time: 08/21/2013 7:53 AM Performed by: Kiondre Grenz Pre-anesthesia Checklist: Patient identified, Emergency Drugs available, Suction available and Patient being monitored Patient Re-evaluated:Patient Re-evaluated prior to inductionOxygen Delivery Method: Circle System Utilized Preoxygenation: Pre-oxygenation with 100% oxygen Intubation Type: IV induction Ventilation: Mask ventilation without difficulty LMA: LMA inserted LMA Size: 4.0 Number of attempts: 1 Airway Equipment and Method: bite block Placement Confirmation: positive ETCO2 Tube secured with: Tape Dental Injury: Teeth and Oropharynx as per pre-operative assessment

## 2013-08-21 NOTE — Anesthesia Preprocedure Evaluation (Addendum)
Anesthesia Evaluation  Patient identified by MRN, date of birth, ID band Patient awake    Reviewed: Allergy & Precautions, H&P , NPO status , Patient's Chart, lab work & pertinent test results  History of Anesthesia Complications Negative for: history of anesthetic complications  Airway Mallampati: II TM Distance: >3 FB Neck ROM: full    Dental  (+) Teeth Intact, Dental Advidsory Given   Pulmonary asthma ,  breath sounds clear to auscultation        Cardiovascular negative cardio ROS  Rhythm:regular Rate:Normal     Neuro/Psych Anxiety negative neurological ROS  negative psych ROS   GI/Hepatic negative GI ROS, Neg liver ROS,   Endo/Other  negative endocrine ROS  Renal/GU negative Renal ROS     Musculoskeletal   Abdominal   Peds  Hematology   Anesthesia Other Findings   Reproductive/Obstetrics negative OB ROS                         Anesthesia Physical Anesthesia Plan  ASA: II  Anesthesia Plan: General   Post-op Pain Management:    Induction: Intravenous  Airway Management Planned: LMA  Additional Equipment:   Intra-op Plan:   Post-operative Plan: Extubation in OR  Informed Consent: I have reviewed the patients History and Physical, chart, labs and discussed the procedure including the risks, benefits and alternatives for the proposed anesthesia with the patient or authorized representative who has indicated his/her understanding and acceptance.   Dental advisory given  Plan Discussed with: CRNA, Anesthesiologist and Surgeon  Anesthesia Plan Comments:         Anesthesia Quick Evaluation

## 2013-08-24 ENCOUNTER — Encounter (HOSPITAL_BASED_OUTPATIENT_CLINIC_OR_DEPARTMENT_OTHER): Payer: Self-pay | Admitting: Orthopedic Surgery

## 2014-05-04 ENCOUNTER — Ambulatory Visit (INDEPENDENT_AMBULATORY_CARE_PROVIDER_SITE_OTHER): Payer: 59 | Admitting: Physician Assistant

## 2014-05-04 VITALS — BP 122/70 | HR 100 | Temp 95.0°F | Resp 18 | Ht 60.5 in | Wt 176.0 lb

## 2014-05-04 DIAGNOSIS — J069 Acute upper respiratory infection, unspecified: Secondary | ICD-10-CM | POA: Diagnosis not present

## 2014-05-04 DIAGNOSIS — R0989 Other specified symptoms and signs involving the circulatory and respiratory systems: Secondary | ICD-10-CM | POA: Diagnosis not present

## 2014-05-04 DIAGNOSIS — J45901 Unspecified asthma with (acute) exacerbation: Secondary | ICD-10-CM | POA: Diagnosis not present

## 2014-05-04 MED ORDER — ALBUTEROL SULFATE (2.5 MG/3ML) 0.083% IN NEBU
2.5000 mg | INHALATION_SOLUTION | Freq: Once | RESPIRATORY_TRACT | Status: AC
  Administered 2014-05-04: 2.5 mg via RESPIRATORY_TRACT

## 2014-05-04 MED ORDER — AZITHROMYCIN 250 MG PO TABS: ORAL_TABLET | ORAL | Status: DC

## 2014-05-04 MED ORDER — IPRATROPIUM BROMIDE 0.03 % NA SOLN: 2.0000 | Freq: Two times a day (BID) | NASAL | Status: AC

## 2014-05-04 MED ORDER — BENZONATATE 100 MG PO CAPS: 100.0000 mg | ORAL_CAPSULE | Freq: Three times a day (TID) | ORAL | Status: DC | PRN

## 2014-05-04 MED ORDER — ALBUTEROL SULFATE (2.5 MG/3ML) 0.083% IN NEBU: 2.5000 mg | INHALATION_SOLUTION | Freq: Four times a day (QID) | RESPIRATORY_TRACT | Status: DC | PRN

## 2014-05-04 MED ORDER — IPRATROPIUM BROMIDE 0.02 % IN SOLN
0.5000 mg | Freq: Once | RESPIRATORY_TRACT | Status: AC
  Administered 2014-05-04: 0.5 mg via RESPIRATORY_TRACT

## 2014-05-04 NOTE — Patient Instructions (Signed)
Pick up antibiotic only if not improvement over the weekend.

## 2014-05-04 NOTE — Progress Notes (Signed)
HPI  Patient presents for 4 days of nasal/chest congestion and sore throat. Additionally endorses loss of voice, fatigue, right side ear pain, productive cough, nausea, postnasal drip, and gurgle sound when breathing. Denies change in appetite, difficulty swallowing, SOB/CP, wheezing, vomiting, or HA. Has h/o asthma and allergies. Took mucinex without relief. Needs a refill of albuterol for nebulizer. No known sick contacts. Med allergy to PCN.   Review of Systems  Constitutional: Positive for fever (resolved.), chills (resolved.) and fatigue. Negative for appetite change.  HENT: Positive for congestion, ear pain, postnasal drip, rhinorrhea, sore throat and voice change. Negative for ear discharge, sinus pressure, sneezing and trouble swallowing.  Eyes: Negative.  Respiratory: Positive for cough. Negative for chest tightness, shortness of breath, wheezing and stridor.  Cardiovascular: Negative for chest pain.  Gastrointestinal: Positive for nausea. Negative for vomiting.  Musculoskeletal: Negative for neck pain and neck stiffness.  Neurological: Negative for dizziness and headaches.    Objective:   Physical Exam  Constitutional: She is oriented to person, place, and time. She appears well-developed and well-nourished. No distress.  Blood pressure 122/70, pulse 100, temperature 95 F (35 C), temperature source Oral, resp. rate 18, height 5' 0.5" (1.537 m), weight 176 lb (79.833 kg), SpO2 95 %.  HENT:  Head: Normocephalic and atraumatic.  Right Ear: External ear normal.  Left Ear: External ear normal.  Mouth/Throat: No oropharyngeal exudate.  Eyes: Conjunctivae are normal. Pupils are equal, round, and reactive to light. Right eye exhibits no discharge. Left eye exhibits no discharge. No scleral icterus.  Neck: Normal range of motion. Neck supple. No thyromegaly present.  Cardiovascular: Normal rate, regular rhythm and normal heart sounds. Exam reveals no gallop and no friction rub.  No  murmur heard.  Pulmonary/Chest: Effort normal. No respiratory distress. She has no decreased breath sounds. She has wheezes in the right upper field, the right middle field, the left upper field and the left middle field. She has no rhonchi. She has no rales. She exhibits no tenderness.  Abdominal: Soft. Bowel sounds are normal. She exhibits no distension and no mass. There is no tenderness. There is no rebound and no guarding.  Lymphadenopathy:  She has no cervical adenopathy.  Neurological: She is alert and oriented to person, place, and time.  Skin: Skin is warm and dry. No rash noted. She is not diaphoretic. No erythema. No pallor.    Assessment & Plan:   1. Chest congestion  - albuterol (PROVENTIL) (2.5 MG/3ML) 0.083% nebulizer solution 2.5 mg; Take 3 mLs (2.5 mg total) by nebulization once.  - ipratropium (ATROVENT) nebulizer solution 0.5 mg; Take 2.5 mLs (0.5 mg total) by nebulization once.  2. Asthma with acute exacerbation, unspecified asthma severity  Wheezing improved post neb.  - albuterol (PROVENTIL) (2.5 MG/3ML) 0.083% nebulizer solution; Take 3 mLs (2.5 mg total) by nebulization every 6 (six) hours as needed for wheezing or shortness of breath. Dispense: 150 mL; Refill: 1  3. Acute upper respiratory infection  Plenty of fluid and rest. Should hold off on taking zpack unless not having any improvement by 3/27-28/16.  - benzonatate (TESSALON) 100 MG capsule; Take 1-2 capsules (100-200 mg total) by mouth 3 (three) times daily as needed for cough. Dispense: 40 capsule; Refill: 0  - ipratropium (ATROVENT) 0.03 % nasal spray; Place 2 sprays into both nostrils 2 (two) times daily. Dispense: 30 mL; Refill: 0  - azithromycin (ZITHROMAX) 250 MG tablet; Take 2 tabs PO x 1 dose, then 1 tab PO QD x  4 days Dispense: 6 tablet; Refill: 0    Omarr Hann PA-C  [facility]  [facility]  05/04/2014 5:05 PM

## 2014-05-18 ENCOUNTER — Ambulatory Visit (INDEPENDENT_AMBULATORY_CARE_PROVIDER_SITE_OTHER): Payer: 59 | Admitting: Family Medicine

## 2014-05-18 VITALS — BP 142/86 | HR 95 | Temp 98.9°F | Resp 18 | Ht 60.5 in | Wt 176.0 lb

## 2014-05-18 DIAGNOSIS — J4531 Mild persistent asthma with (acute) exacerbation: Secondary | ICD-10-CM | POA: Diagnosis not present

## 2014-05-18 MED ORDER — PREDNISONE 20 MG PO TABS: ORAL_TABLET | ORAL | Status: DC

## 2014-05-18 MED ORDER — HYDROCODONE-HOMATROPINE 5-1.5 MG/5ML PO SYRP: 5.0000 mL | ORAL_SOLUTION | ORAL | Status: DC | PRN

## 2014-05-18 NOTE — Progress Notes (Signed)
Subjective: 64 year old lady with a history of being here couple of weeks ago with having had a respiratory tract infection and developed wheezing and coughing. She was treated with an albuterol nebulizer and did better. She was given a prescription for the inhalant solution, but does not have a nebulizer machine. She did call her other doctor's office and was given a albuterol inhaler which she has used. She does not smoke. She is not coughing up a lot of stuff. But the cough is killing her when she lays down at night. She is not running any fever. She has a history of exercise-induced asthma, but this is more prolonged than she has had. When she took the Tessalon pills she got dizzy so does not take a lot of them.  Objective: Coughing frequently. Alert and oriented. Throat clear. Neck supple without nodes. Chest is clear to auscultation but has decreased air movement.  peak flow was done and showed her best was 335 with an average of about 320. Predicted value is 380 or 390. Heart was regular without murmurs.  Assessment: Mild persistent asthma Cough  Plan: Tapered dose of prednisone Try to get her nebulizer machine. She been given the inhalant solution already. She can use that interchangeably with the inhaler. Instructed her on proper use of the inhaler. Cough syrup for bedtime use  Return if worse

## 2014-05-18 NOTE — Patient Instructions (Addendum)
Drink plenty of fluids to stay well hydrated  Take the cough syrup primarily at bedtime. It will tend to make you drowsy if you take it when you're going to work  Use either the albuterol nebulizer machine or the albuterol inhaler every 4-6 hours as needed for wheezing or coughing  Take the prednisone 3 pills daily for 2 days, then 2 daily for 2 days, then 1 daily for 2 days. Best taken after breakfast.  You can continue to use the Tessalon (benzonatate) pills if desired, however be cautious since you said they make you dizzy  Return if not improving

## 2014-05-19 ENCOUNTER — Telehealth: Payer: Self-pay

## 2014-05-19 NOTE — Telephone Encounter (Signed)
Faxed

## 2014-05-19 NOTE — Telephone Encounter (Signed)
Tammy works at [employer] and needs office visit notes from 4/5 concerning [name], so she can fill her nebulizer. Please advise at [phone]

## 2014-12-23 DIAGNOSIS — H25819 Combined forms of age-related cataract, unspecified eye: Secondary | ICD-10-CM | POA: Insufficient documentation

## 2015-03-16 ENCOUNTER — Ambulatory Visit (INDEPENDENT_AMBULATORY_CARE_PROVIDER_SITE_OTHER): Payer: 59 | Admitting: Family Medicine

## 2015-03-16 VITALS — BP 124/80 | HR 86 | Temp 98.9°F | Resp 17 | Ht 62.0 in | Wt 185.0 lb

## 2015-03-16 DIAGNOSIS — Z8709 Personal history of other diseases of the respiratory system: Secondary | ICD-10-CM

## 2015-03-16 DIAGNOSIS — J069 Acute upper respiratory infection, unspecified: Secondary | ICD-10-CM

## 2015-03-16 NOTE — Progress Notes (Addendum)
Subjective:    Patient ID: Brianna Daniel, female    DOB: [birth year], 65 y.o.   MRN: [id number] By signing my name below, I, Brianna Daniel, attest that this documentation has been prepared under the direction and in the presence of Brianna Staggers, MD.  Electronically Signed: Littie Daniel, Medical Scribe. 03/16/2015. 2:59 PM.  HPI HPI Comments: Brianna Daniel is a 65 y.o. female with a history of asthma who presents to the [facility] complaining of gradual onset sore throat that started 2 days ago. Patient reports having associated postnasal drip, congestion, and intermittent rhinorrhea. She has been using throat lozenges, Chloraseptic, and Aleve with some relief. She has had sick contacts. Patient denies fever and wheezing. She has not had any recent problems with her asthma. She did receive the flu shot this year.  Patient was last seen in April 2016 for asthma exacerbation and acute URI. She uses the albuterol as needed; this is not a daily medication.    Patient Active Problem List   Diagnosis Date Noted  . 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 Cataract   . Allergy   . Osteopenia   . Anxiety   . Asthma     exercise induced   Past Surgical History  Procedure Laterality Date  . Abdominal hysterectomy    . Tubal ligation    . Bladder surgery      2001  . Mass excision Right 08/21/2013    Procedure: RIGHT WRIST MASS EXCISION/ARTHROTOMY SYNOVECTOMY WITH JOINT DEBRIDEMENT;  Surgeon: Dominica Severin, MD;  Location: [facility];  Service: Orthopedics;  Laterality: Right;  . Synovectomy Right 08/21/2013    Procedure: SYNOVECTOMY;  Surgeon: Dominica Severin, MD;  Location:  SURGERY CENTER;  Service: Orthopedics;  Laterality: Right;   Allergies  Allergen Reactions  . Penicillins Hives   Prior to Admission medications   Medication Sig Start Date End Date Taking? Authorizing Provider  albuterol  (PROVENTIL) (2.5 MG/3ML) 0.083% nebulizer solution Take 3 mLs (2.5 mg total) by nebulization every 6 (six) hours as needed for wheezing or shortness of breath. 05/04/14  Yes Tishira R Brewington, PA-C  ALBUTEROL IN Inhale into the lungs.   Yes Historical Provider, MD  ARIPiprazole (ABILIFY) 2 MG tablet Take 2 mg by mouth daily.   Yes Historical Provider, MD  FLUoxetine (PROZAC) 10 MG tablet Take 10 mg by mouth daily.   Yes Historical Provider, MD  ipratropium (ATROVENT) 0.03 % nasal spray Place 2 sprays into both nostrils 2 (two) times daily. 05/04/14  Yes Tishira R Brewington, PA-C  simvastatin (ZOCOR) 10 MG tablet Take 10 mg by mouth daily.   Yes Historical Provider, MD  citalopram (CELEXA) 40 MG tablet Take 40 mg by mouth daily. Reported on 03/16/2015    Historical Provider, MD   Social History   Social History  . Marital Status: Divorced    Spouse Name: N/A  . Number of Children: N/A  . Years of Education: N/A   Occupational History  . Not on file.   Social History Main Topics  . Smoking status: Never Smoker   . Smokeless tobacco: Never Used  . Alcohol Use: No  . Drug Use: No  . Sexual Activity: No   Other Topics Concern  . Not on file   Social History Narrative     Review of Systems  Constitutional: Negative for fever.  HENT: Positive for congestion, postnasal drip and sore  throat.   Respiratory: Negative for wheezing.        Objective:   Physical Exam  Constitutional: She is oriented to person, place, and time. She appears well-developed and well-nourished. No distress.  HENT:  Head: Normocephalic and atraumatic.  Right Ear: Hearing, tympanic membrane, external ear and ear canal normal.  Left Ear: Hearing, tympanic membrane, external ear and ear canal normal.  Nose: Nose normal.  Mouth/Throat: Oropharynx is clear and moist. No oropharyngeal exudate or posterior oropharyngeal erythema.  Sinuses non-tender. Clear mucosa in throat.  Eyes: Conjunctivae and EOM are  normal. Pupils are equal, round, and reactive to light.  Neck:  No lymphadenopathy.  Cardiovascular: Normal rate, regular rhythm, normal heart sounds and intact distal pulses.   No murmur heard. Pulmonary/Chest: Effort normal and breath sounds normal. No respiratory distress. She has no wheezes. She has no rhonchi.  Clear to auscultation bilaterally.   Lymphadenopathy:    She has no cervical adenopathy.  Neurological: She is alert and oriented to person, place, and time.  Skin: Skin is warm and dry. No rash noted.  Psychiatric: She has a normal mood and affect. Her behavior is normal.  Vitals reviewed.   Filed Vitals:   03/16/15 1429  BP: 124/80  Pulse: 86  Temp: 98.9 F (37.2 C)  TempSrc: Oral  Resp: 17  Height: 5\' 2"  (1.575 m)  Weight: 185 lb (83.915 kg)  SpO2: 97%         Assessment & Plan:   Analyn Matusek is a 65 y.o. female Acute upper respiratory infection  History of asthma   Suspected viral illness. Reassuring exam, and no wheeze on exam or signs of asthma flare at this time. Symptomatic care discussed as in AVS, but return to clinic precautions were discussed including asthma symptoms or persistent need for albuterol.    Patient Instructions  Saline nasal spray atleast 4 times per day, over the counter mucinex or mucinex DM (sa long as this is not increasing wheeze), drink plenty of fluids.  cepacol or other lozenge for sore throat.   Return to clinic if worsening,  Including asthma symptoms that are not improved within 24-48 hours of albuterol use, or fevers.  Return to the clinic or go to the nearest emergency room if any of your symptoms worsen or new symptoms occur.  Upper Respiratory Infection, Adult Most upper respiratory infections (URIs) are a viral infection of the air passages leading to the lungs. A URI affects the nose, throat, and upper air passages. The most common type of URI is nasopharyngitis and is typically referred to as "the common  cold." URIs run their course and usually go away on their own. Most of the time, a URI does not require medical attention, but sometimes a bacterial infection in the upper airways can follow a viral infection. This is called a secondary infection. Sinus and middle ear infections are common types of secondary upper respiratory infections. Bacterial pneumonia can also complicate a URI. A URI can worsen asthma and chronic obstructive pulmonary disease (COPD). Sometimes, these complications can require emergency medical care and may be life threatening.  CAUSES Almost all URIs are caused by viruses. A virus is a type of germ and can spread from one person to another.  RISKS FACTORS You may be at risk for a URI if:   You smoke.   You have chronic heart or lung disease.  You have a weakened defense (immune) system.   You are very young or very  old.   You have nasal allergies or asthma.  You work in crowded or poorly ventilated areas.  You work in health care facilities or schools. SIGNS AND SYMPTOMS  Symptoms typically develop 2-3 days after you come in contact with a cold virus. Most viral URIs last 7-10 days. However, viral URIs from the influenza virus (flu virus) can last 14-18 days and are typically more severe. Symptoms may include:   Runny or stuffy (congested) nose.   Sneezing.   Cough.   Sore throat.   Headache.   Fatigue.   Fever.   Loss of appetite.   Pain in your forehead, behind your eyes, and over your cheekbones (sinus pain).  Muscle aches.  DIAGNOSIS  Your health care provider may diagnose a URI by:  Physical exam.  Tests to check that your symptoms are not due to another condition such as:  Strep throat.  Sinusitis.  Pneumonia.  Asthma. TREATMENT  A URI goes away on its own with time. It cannot be cured with medicines, but medicines may be prescribed or recommended to relieve symptoms. Medicines may help:  Reduce your fever.  Reduce  your cough.  Relieve nasal congestion. HOME CARE INSTRUCTIONS   Take medicines only as directed by your health care provider.   Gargle warm saltwater or take cough drops to comfort your throat as directed by your health care provider.  Use a warm mist humidifier or inhale steam from a shower to increase air moisture. This may make it easier to breathe.  Drink enough fluid to keep your urine clear or pale yellow.   Eat soups and other clear broths and maintain good nutrition.   Rest as needed.   Return to work when your temperature has returned to normal or as your health care provider advises. You may need to stay home longer to avoid infecting others. You can also use a face mask and careful hand washing to prevent spread of the virus.  Increase the usage of your inhaler if you have asthma.   Do not use any tobacco products, including cigarettes, chewing tobacco, or electronic cigarettes. If you need help quitting, ask your health care provider. PREVENTION  The best way to protect yourself from getting a cold is to practice good hygiene.   Avoid oral or hand contact with people with cold symptoms.   Wash your hands often if contact occurs.  There is no clear evidence that vitamin C, vitamin E, echinacea, or exercise reduces the chance of developing a cold. However, it is always recommended to get plenty of rest, exercise, and practice good nutrition.  SEEK MEDICAL CARE IF:   You are getting worse rather than better.   Your symptoms are not controlled by medicine.   You have chills.  You have worsening shortness of breath.  You have brown or red mucus.  You have yellow or brown nasal discharge.  You have pain in your face, especially when you bend forward.  You have a fever.  You have swollen neck glands.  You have pain while swallowing.  You have white areas in the back of your throat. SEEK IMMEDIATE MEDICAL CARE IF:   You have severe or  persistent:  Headache.  Ear pain.  Sinus pain.  Chest pain.  You have chronic lung disease and any of the following:  Wheezing.  Prolonged cough.  Coughing up blood.  A change in your usual mucus.  You have a stiff neck.  You have changes in your:  Vision.  Hearing.  Thinking.  Mood. MAKE SURE YOU:   Understand these instructions.  Will watch your condition.  Will get help right away if you are not doing well or get worse.   This information is not intended to replace advice given to you by your health care provider. Make sure you discuss any questions you have with your health care provider.   Document Released: 07/25/2000 Document Revised: 06/15/2014 Document Reviewed: 05/06/2013 Elsevier Interactive Patient Education [employer]      I personally performed the services described in this documentation, which was scribed in my presence. The recorded information has been reviewed and considered, and addended by me as needed.

## 2015-03-16 NOTE — Patient Instructions (Signed)
Saline nasal spray atleast 4 times per day, over the counter mucinex or mucinex DM (sa long as this is not increasing wheeze), drink plenty of fluids.  cepacol or other lozenge for sore throat.   Return to clinic if worsening,  Including asthma symptoms that are not improved within 24-48 hours of albuterol use, or fevers.  Return to the clinic or go to the nearest emergency room if any of your symptoms worsen or new symptoms occur.  Upper Respiratory Infection, Adult Most upper respiratory infections (URIs) are a viral infection of the air passages leading to the lungs. A URI affects the nose, throat, and upper air passages. The most common type of URI is nasopharyngitis and is typically referred to as "the common cold." URIs run their course and usually go away on their own. Most of the time, a URI does not require medical attention, but sometimes a bacterial infection in the upper airways can follow a viral infection. This is called a secondary infection. Sinus and middle ear infections are common types of secondary upper respiratory infections. Bacterial pneumonia can also complicate a URI. A URI can worsen asthma and chronic obstructive pulmonary disease (COPD). Sometimes, these complications can require emergency medical care and may be life threatening.  CAUSES Almost all URIs are caused by viruses. A virus is a type of germ and can spread from one person to another.  RISKS FACTORS You may be at risk for a URI if:   You smoke.   You have chronic heart or lung disease.  You have a weakened defense (immune) system.   You are very young or very old.   You have nasal allergies or asthma.  You work in crowded or poorly ventilated areas.  You work in health care facilities or schools. SIGNS AND SYMPTOMS  Symptoms typically develop 2-3 days after you come in contact with a cold virus. Most viral URIs last 7-10 days. However, viral URIs from the influenza virus (flu virus) can last 14-18  days and are typically more severe. Symptoms may include:   Runny or stuffy (congested) nose.   Sneezing.   Cough.   Sore throat.   Headache.   Fatigue.   Fever.   Loss of appetite.   Pain in your forehead, behind your eyes, and over your cheekbones (sinus pain).  Muscle aches.  DIAGNOSIS  Your health care provider may diagnose a URI by:  Physical exam.  Tests to check that your symptoms are not due to another condition such as:  Strep throat.  Sinusitis.  Pneumonia.  Asthma. TREATMENT  A URI goes away on its own with time. It cannot be cured with medicines, but medicines may be prescribed or recommended to relieve symptoms. Medicines may help:  Reduce your fever.  Reduce your cough.  Relieve nasal congestion. HOME CARE INSTRUCTIONS   Take medicines only as directed by your health care provider.   Gargle warm saltwater or take cough drops to comfort your throat as directed by your health care provider.  Use a warm mist humidifier or inhale steam from a shower to increase air moisture. This may make it easier to breathe.  Drink enough fluid to keep your urine clear or pale yellow.   Eat soups and other clear broths and maintain good nutrition.   Rest as needed.   Return to work when your temperature has returned to normal or as your health care provider advises. You may need to stay home longer to avoid infecting others.  You can also use a face mask and careful hand washing to prevent spread of the virus.  Increase the usage of your inhaler if you have asthma.   Do not use any tobacco products, including cigarettes, chewing tobacco, or electronic cigarettes. If you need help quitting, ask your health care provider. PREVENTION  The best way to protect yourself from getting a cold is to practice good hygiene.   Avoid oral or hand contact with people with cold symptoms.   Wash your hands often if contact occurs.  There is no clear  evidence that vitamin C, vitamin E, echinacea, or exercise reduces the chance of developing a cold. However, it is always recommended to get plenty of rest, exercise, and practice good nutrition.  SEEK MEDICAL CARE IF:   You are getting worse rather than better.   Your symptoms are not controlled by medicine.   You have chills.  You have worsening shortness of breath.  You have brown or red mucus.  You have yellow or brown nasal discharge.  You have pain in your face, especially when you bend forward.  You have a fever.  You have swollen neck glands.  You have pain while swallowing.  You have white areas in the back of your throat. SEEK IMMEDIATE MEDICAL CARE IF:   You have severe or persistent:  Headache.  Ear pain.  Sinus pain.  Chest pain.  You have chronic lung disease and any of the following:  Wheezing.  Prolonged cough.  Coughing up blood.  A change in your usual mucus.  You have a stiff neck.  You have changes in your:  Vision.  Hearing.  Thinking.  Mood. MAKE SURE YOU:   Understand these instructions.  Will watch your condition.  Will get help right away if you are not doing well or get worse.   This information is not intended to replace advice given to you by your health care provider. Make sure you discuss any questions you have with your health care provider.   Document Released: 07/25/2000 Document Revised: 06/15/2014 Document Reviewed: 05/06/2013 Elsevier Interactive Patient Education [employer]

## 2015-04-27 ENCOUNTER — Other Ambulatory Visit: Payer: Self-pay | Admitting: Radiology

## 2016-04-23 DIAGNOSIS — E782 Mixed hyperlipidemia: Secondary | ICD-10-CM | POA: Insufficient documentation

## 2016-04-23 DIAGNOSIS — I10 Essential (primary) hypertension: Secondary | ICD-10-CM | POA: Insufficient documentation

## 2016-06-01 DIAGNOSIS — Z86010 Personal history of colonic polyps: Secondary | ICD-10-CM | POA: Insufficient documentation

## 2017-04-17 ENCOUNTER — Ambulatory Visit: Payer: 59 | Admitting: Emergency Medicine

## 2017-05-30 DIAGNOSIS — M461 Sacroiliitis, not elsewhere classified: Secondary | ICD-10-CM | POA: Insufficient documentation

## 2017-09-16 DIAGNOSIS — H04129 Dry eye syndrome of unspecified lacrimal gland: Secondary | ICD-10-CM | POA: Insufficient documentation

## 2017-09-16 DIAGNOSIS — E669 Obesity, unspecified: Secondary | ICD-10-CM | POA: Insufficient documentation

## 2017-09-16 DIAGNOSIS — Z6832 Body mass index (BMI) 32.0-32.9, adult: Secondary | ICD-10-CM

## 2017-10-12 ENCOUNTER — Emergency Department (INDEPENDENT_AMBULATORY_CARE_PROVIDER_SITE_OTHER): Payer: Medicare Other

## 2017-10-12 ENCOUNTER — Emergency Department
Admission: EM | Admit: 2017-10-12 | Discharge: 2017-10-12 | Disposition: A | Discharge status: Home / Self Care | Payer: Medicare Other | Source: Home / Self Care | Attending: Family Medicine | Admitting: Family Medicine

## 2017-10-12 ENCOUNTER — Other Ambulatory Visit: Payer: Self-pay

## 2017-10-12 DIAGNOSIS — S300XXA Contusion of lower back and pelvis, initial encounter: Secondary | ICD-10-CM

## 2017-10-12 DIAGNOSIS — M545 Low back pain: Secondary | ICD-10-CM

## 2017-10-12 MED ORDER — HYDROCODONE-ACETAMINOPHEN 5-325 MG PO TABS: ORAL_TABLET | ORAL | 0 refills | Status: AC

## 2017-10-12 MED ORDER — MELOXICAM 15 MG PO TABS: 15.0000 mg | ORAL_TABLET | Freq: Every day | ORAL | 0 refills | Status: AC

## 2017-10-12 MED ORDER — BACLOFEN 5 MG PO TABS: 1.0000 | ORAL_TABLET | Freq: Three times a day (TID) | ORAL | 1 refills | Status: AC

## 2017-10-12 NOTE — ED Triage Notes (Signed)
Patient fell off 2-step stool two days ago and landed on coccyx area; pain has persisted despite ice, old rx of meloxicam and baclofen.

## 2017-10-12 NOTE — ED Provider Notes (Signed)
[facility]    CSN: [id number] Arrival date & time: 10/12/17  1253     History   Chief Complaint Chief Complaint  Patient presents with  . Tailbone Pain    HPI Brianna Daniel is a 67 y.o. female.   Patient fell off a two step stool two days ago, landing on her coccyx area.  She complains of persistent non-radiating pain in her lower back and coccyx area.   She denies bowel or bladder dysfunction, and no saddle numbness.   Her pain has not improved with baclofen, meloxicam, and hot soaks. She has a history of sacroiliac joint dysfunction, having undergone injection of her right SI joint 05/30/17.  The history is provided by the patient.  Back Pain  Location:  Sacro-iliac joint and lumbar spine Quality:  Aching Radiates to:  Does not radiate Pain severity:  Moderate Pain is:  Same all the time Onset quality:  Sudden Duration:  2 days Timing:  Constant Progression:  Unchanged Chronicity:  New Context: falling   Relieved by:  Nothing Worsened by:  Palpation, ambulation and sitting Ineffective treatments:  NSAIDs, muscle relaxants and heating pad Associated symptoms: no abdominal pain, no abdominal swelling, no bladder incontinence, no bowel incontinence, no chest pain, no dysuria, no fever, no leg pain, no numbness, no paresthesias, no pelvic pain, no perianal numbness, no tingling and no weakness     Past Medical History:  Diagnosis Date  . Allergy   . Anxiety   . Asthma    exercise induced  . Cataract   . Osteopenia     Patient Active Problem List   Diagnosis Date Noted  . 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 ABDOMINAL HYSTERECTOMY    . BLADDER SURGERY     2001  . MASS EXCISION Right 08/21/2013   Procedure: RIGHT WRIST MASS EXCISION/ARTHROTOMY SYNOVECTOMY WITH JOINT DEBRIDEMENT;  Surgeon: Dominica Severin, MD;  Location: [facility];  Service: Orthopedics;  Laterality:  Right;  . SYNOVECTOMY Right 08/21/2013   Procedure: SYNOVECTOMY;  Surgeon: Dominica Severin, MD;  Location: [facility];  Service: Orthopedics;  Laterality: Right;  . TUBAL LIGATION      OB History   None      Home Medications    Prior to Admission medications   Medication Sig Start Date End Date Taking? Authorizing Provider  albuterol (PROVENTIL) (2.5 MG/3ML) 0.083% nebulizer solution Take 3 mLs (2.5 mg total) by nebulization every 6 (six) hours as needed for wheezing or shortness of breath. 05/04/14   Brewington, Tishira R, PA-C  ALBUTEROL IN Inhale into the lungs.    [provider]  ARIPiprazole (ABILIFY) 2 MG tablet Take by mouth.    [provider]  aspirin 81 MG EC tablet Take by mouth.    [provider]  Baclofen 5 MG TABS Take 1 tablet by mouth 3 (three) times daily. 10/12/17   Lattie Haw, MD  citalopram (CELEXA) 40 MG tablet Take 40 mg by mouth daily. Reported on 03/16/2015    [provider]  FLUoxetine (PROZAC) 10 MG tablet Take 10 mg by mouth daily.    [provider]  HYDROcodone-acetaminophen (NORCO/VICODIN) 5-325 MG tablet Take one by mouth at bedtime as needed for pain.  May repeat in 6 hours 10/12/17   Lattie Haw, MD  ipratropium (ATROVENT) 0.03 % nasal spray Place 2 sprays into both nostrils 2 (two) times daily.  05/04/14   Brewington, Tishira R, PA-C  ketorolac (ACULAR) 0.5 % ophthalmic solution INSTILL 1 DROP INTO OPERATIVE EYE 4 TIMES A DAY BEGINNING 3 DAYS PRIOR TO SURGERY 12/15/14   [provider]  meloxicam (MOBIC) 15 MG tablet Take 1 tablet (15 mg total) by mouth daily. Take with food each morning 10/12/17   Lattie HawBeese, Zerrick Hanssen A, MD  metoprolol succinate (TOPROL XL) 50 MG 24 hr tablet Take by mouth. 07/27/14 07/27/15  [provider]  nitroGLYCERIN (NITROSTAT) 0.4 MG SL tablet Place under the tongue. 07/27/14 07/27/15  [provider]  simvastatin (ZOCOR) 10 MG tablet Take 10 mg by  mouth daily.    [provider]  VIGAMOX 0.5 % ophthalmic solution INSTILL 1 DROP INTO THE AFFECTED EYE FOUR TIMES A DAY STARTING 3 DAYS PRIOR TO SURGERY 12/15/14   [provider]    Family History Family History  Problem Relation Age of Onset  . Hyperlipidemia Sister   . Heart disease Brother   . Hyperlipidemia Sister     Social History Social History   Tobacco Use  . Smoking status: Never Smoker  . Smokeless tobacco: Never Used  Substance Use Topics  . Alcohol use: No  . Drug use: No     Allergies   Penicillins   Review of Systems Review of Systems  Constitutional: Negative for fever.  Cardiovascular: Negative for chest pain.  Gastrointestinal: Negative for abdominal pain and bowel incontinence.  Genitourinary: Negative for bladder incontinence, dysuria and pelvic pain.  Musculoskeletal: Positive for back pain.  Neurological: Negative for tingling, weakness, numbness and paresthesias.  All other systems reviewed and are negative.    Physical Exam Triage Vital Signs ED Triage Vitals  Enc Vitals Group     BP 10/12/17 1351 121/81     Pulse Rate 10/12/17 1351 77     Resp 10/12/17 1351 16     Temp 10/12/17 1351 98.7 F (37.1 C)     Temp Source 10/12/17 1351 Oral     SpO2 10/12/17 1351 99 %     Weight 10/12/17 1352 165 lb (74.8 kg)     Height 10/12/17 1352 5' 0.75" (1.543 m)     Head Circumference --      Peak Flow --      Pain Score 10/12/17 1352 7     Pain Loc --      Pain Edu? --      Excl. in GC? --    No data found.  Updated Vital Signs BP 121/81 (BP Location: Right Arm)   Pulse 77   Temp 98.7 F (37.1 C) (Oral)   Resp 16   Ht 5' 0.75" (1.543 m)   Wt 74.8 kg   SpO2 99%   BMI 31.43 kg/m   Visual Acuity Right Eye Distance:   Left Eye Distance:   Bilateral Distance:    Right Eye Near:   Left Eye Near:    Bilateral Near:     Physical Exam  Constitutional: She appears well-developed and well-nourished. No distress.    HENT:  Head: Atraumatic.  Eyes: Pupils are equal, round, and reactive to light.  Neck: Normal range of motion.  Cardiovascular: Normal heart sounds.  Pulmonary/Chest: Breath sounds normal.  Abdominal: There is no tenderness.  Musculoskeletal: She exhibits no edema.       Back:   Back:  Range of motion relatively well preserved.  Can heel/toe walk and squat without difficulty.    Tenderness in the midline and  left paraspinous muscles from L4 to Sacral area.  Straight leg raising test is negative.  Sitting knee extension test is negative.  Strength and sensation in the lower extremities is normal.  Patellar and achilles reflexes are normal.  Diffuse tenderness over coccyx.   Neurological: She is alert.  Skin: Skin is warm and dry.  Nursing note and vitals reviewed.    UC Treatments / Results  Labs (all labs ordered are listed, but only abnormal results are displayed) Labs Reviewed - No data to display  EKG None  Radiology Dg Lumbar Spine Complete  Result Date: 10/12/2017 CLINICAL DATA:  Low back pain since a fall 10/10/2017. Initial encounter. EXAM: LUMBAR SPINE - COMPLETE 4+ VIEW COMPARISON:  CT abdomen and pelvis 05/31/2008. FINDINGS: The patient has a superior endplate compression fracture of L1 with vertebral body height loss of approximately 40%. The fracture cannot be definitively characterized but appears remote. Vertebral body height is otherwise normal. Intervertebral disc space height is maintained throughout the lumbar spine. Anterior endplate spurring lower thoracic spine noted. Facet degenerative disease lower lumbar spine is seen. Paraspinous structures are unremarkable. IMPRESSION: Mild superior endplate compression fracture of L1 cannot be definitively characterized but appears remote. Lower lumbar facet arthropathy. Electronically Signed   By: Drusilla Kanner M.D.   On: 10/12/2017 14:39   Dg Sacrum/coccyx  Result Date: 10/12/2017 CLINICAL DATA:  Low back pain since a  fall 10/10/2017. Initial encounter. EXAM: SACRUM AND COCCYX - 2+ VIEW COMPARISON:  None. FINDINGS: There is no evidence of fracture or other focal bone lesions. Lower lumbar facet arthropathy noted. IMPRESSION: No acute abnormality. Electronically Signed   By: Drusilla Kanner M.D.   On: 10/12/2017 14:39    Procedures Procedures (including critical care time)  Medications Ordered in UC Medications - No data to display  Initial Impression / Assessment and Plan / UC Course  I have reviewed the triage vital signs and the nursing notes.  Pertinent labs & imaging results that were available during my care of the patient were reviewed by me and considered in my medical decision making (see chart for details).    May continue Mobic and Baclofen. Rx for Lortab at bedtime (#10, no refill). Controlled Substance Prescriptions I have consulted the [place]rolled Substances Registry for this patient, and feel the risk/benefit ratio today is favorable for proceeding with this prescription for a controlled substance.   Followup with sports med.  Final Clinical Impressions(s) / UC Diagnoses   Final diagnoses:  Contusion of coccyx, initial encounter  Contusion of sacrum, initial encounter     Discharge Instructions     Apply ice pack for 20 to 30 minutes, 3 to 4 times daily  Continue until pain and swelling decrease.  Recommend "doughnut" pillow for sitting    ED Prescriptions    Medication Sig Dispense Auth. Provider   meloxicam (MOBIC) 15 MG tablet Take 1 tablet (15 mg total) by mouth daily. Take with food each morning 15 tablet Lattie Haw, MD   Baclofen 5 MG TABS Take 1 tablet by mouth 3 (three) times daily. 20 tablet Lattie Haw, MD   HYDROcodone-acetaminophen (NORCO/VICODIN) 5-325 MG tablet Take one by mouth at bedtime as needed for pain.  May repeat in 6 hours 10 tablet Cathren Harsh Tera Mater, MD        Lattie Haw, MD 10/15/17 [phone]

## 2017-10-12 NOTE — Discharge Instructions (Signed)
Apply ice pack for 20 to 30 minutes, 3 to 4 times daily  Continue until pain and swelling decrease.  Recommend "doughnut" pillow for sitting

## 2017-10-22 ENCOUNTER — Encounter: Payer: Self-pay | Admitting: Family Medicine

## 2017-10-22 ENCOUNTER — Ambulatory Visit: Payer: Medicare Other | Admitting: Family Medicine

## 2017-10-22 VITALS — BP 126/81 | HR 90 | Wt 167.0 lb

## 2017-10-22 DIAGNOSIS — T148XXA Other injury of unspecified body region, initial encounter: Secondary | ICD-10-CM | POA: Diagnosis not present

## 2017-10-22 DIAGNOSIS — M533 Sacrococcygeal disorders, not elsewhere classified: Secondary | ICD-10-CM

## 2017-10-22 MED ORDER — MUPIROCIN 2 % EX OINT: TOPICAL_OINTMENT | CUTANEOUS | 3 refills | Status: AC

## 2017-10-22 NOTE — Patient Instructions (Addendum)
Thank you for coming in today.  Use the cream on the skin irritation  Recheck in 3 weeks.  Attend physical therapy.    Let me know if not doing well.   Use a doughnut cushion.    Tailbone Injury The tailbone (coccyx) is the small bone at the lower end of the spine. A tailbone injury may involve stretched ligaments, bruising, or a broken bone (fracture). Tailbone injuries can be painful, and some may take a long time to heal. What are the causes? This condition is often caused by falling and landing on the tailbone. Other causes include:  Repeated strain or friction from actions such as rowing and bicycling.  Childbirth.  In some cases, the cause may not be known. What increases the risk? This condition is more common in women than in men. What are the signs or symptoms? Symptoms of this condition include:  Pain in the lower back, especially when sitting.  Pain or difficulty when standing up from a sitting position.  Bruising in the tailbone area.  Painful bowel movements.  In women, pain during intercourse.  How is this diagnosed? This condition may be diagnosed based on your symptoms and a physical exam. X-rays may be taken if a fracture is suspected. You may also have other tests, such as a CT scan or MRI. How is this treated? This condition may be treated with medicines to help relieve your pain. Most tailbone injuries heal on their own in 4-6 weeks. However, recovery time may be longer if the injury involves a fracture. Follow these instructions at home:  Take medicines only as directed by your health care provider.  If directed, apply ice to the injured area: ? Put ice in a plastic bag. ? Place a towel between your skin and the bag. ? Leave the ice on for 20 minutes, 2-3 times per day for the first 1-2 days.  Sit on a large, rubber or inflated ring or cushion to ease your pain. Lean forward when you are sitting to help decrease discomfort.  Avoid sitting for  long periods of time.  Increase your activity as the pain allows. Perform any exercises that are recommended by your health care provider or physical therapist.  If you have pain during bowel movements, use stool softeners as directed by your health care provider.  Eat a diet that includes plenty of fiber to help prevent constipation.  Keep all follow-up visits as directed by your health care provider. This is important. How is this prevented? Wear appropriate padding and sports gear when bicycling and rowing. This can help to prevent developing an injury that is caused by repeated strain or friction. Contact a health care provider if:  Your pain becomes worse.  Your bowel movements cause a great deal of discomfort.  You are unable to have a bowel movement.  You have uncontrolled urine loss (urinary incontinence).  You have a fever. This information is not intended to replace advice given to you by your health care provider. Make sure you discuss any questions you have with your health care provider. Document Released: 01/27/2000 Document Revised: 09/29/2015 Document Reviewed: 01/25/2014 Elsevier Interactive Patient Education  [employer].

## 2017-10-22 NOTE — Progress Notes (Signed)
Subjective:    CC: Coccydynia  HPI: Patient fell landing on her buttocks on August 29.  She was seen in the urgent care on August 31.  At that time she was found to have a coccyx contusion after x-rays were significant only for an old appearing L1 compression fracture.  She has a pertinent past orthopedic history for SI joint dysfunction and pain receiving injections periodically most recently in June.  She notes that her right SI joint has worsened a bit since the fall.  She denies any radiating pain weakness or numbness fevers or chills.  She notes pain is worse with sitting and with back motion.  She is able to lay comfortably.  Past medical history, Surgical history, Family history not pertinant except as noted below, Social history, Allergies, and medications have been entered into the medical record, reviewed, and no changes needed.   Review of Systems: No headache, visual changes, nausea, vomiting, diarrhea, constipation, dizziness, abdominal pain, skin rash, fevers, chills, night sweats, weight loss, swollen lymph nodes, body aches, joint swelling, muscle aches, chest pain, shortness of breath, mood changes, visual or auditory hallucinations.   Objective:    Vitals:   10/22/17 0849  BP: 126/81  Pulse: 90   General: Well Developed, well nourished, and in no acute distress.  Neuro/Psych: Alert and oriented x3, extra-ocular muscles intact, able to move all 4 extremities, sensation grossly intact. Skin: Warm and dry, no rashes noted.  Respiratory: Not using accessory muscles, speaking in full sentences, trachea midline.  Cardiovascular: Pulses palpable, no extremity edema. Abdomen: Does not appear distended. MSK: Lspine: Nontender to spinal midline.  Tender palpation right SI joint. Lumbar motion limited due to pain. Pelvis: Skin overlying coccyx and gluteal cleft with small erythema and mild breakdown consistent appearance with grade 1 pressure sore.  No surrounding  induration or erythema indicating pilonidal cyst.  The coccyx itself is tender to palpation  Lab and Radiology Results  EXAM: SACRUM AND COCCYX - 2+ VIEW  COMPARISON:  None.  FINDINGS: There is no evidence of fracture or other focal bone lesions. Lower lumbar facet arthropathy noted.  IMPRESSION: No acute abnormality.   Electronically Signed   By: Drusilla Kanner M.D.   On: 10/12/2017 14:39  EXAM: LUMBAR SPINE - COMPLETE 4+ VIEW  COMPARISON:  CT abdomen and pelvis 05/31/2008.  FINDINGS: The patient has a superior endplate compression fracture of L1 with vertebral body height loss of approximately 40%. The fracture cannot be definitively characterized but appears remote. Vertebral body height is otherwise normal. Intervertebral disc space height is maintained throughout the lumbar spine. Anterior endplate spurring lower thoracic spine noted. Facet degenerative disease lower lumbar spine is seen. Paraspinous structures are unremarkable.  IMPRESSION: Mild superior endplate compression fracture of L1 cannot be definitively characterized but appears remote.  Lower lumbar facet arthropathy.   Electronically Signed   By: Drusilla Kanner M.D.   On: 10/12/2017 14:39  I personally (independently) visualized and performed the interpretation of the images attached in this note.   Impression and Recommendations:    Assessment and Plan: 67 y.o. female with  Coccydynia with overlying abrasion to skin overlying coccyx.  Has history of SI joint dysfunction.  Additionally patient has evidence of re-exacerbation of her right low back pain/SI joint pain.  She will benefit from physical therapy for both her coccydynia and her SI joint dysfunction.  Additionally will treat abrasion overlying the sacrum with mupirocin antibiotic ointment.  Recheck in 2 to 3 weeks.  Orders Placed This Encounter  Procedures  . Ambulatory referral to Physical Therapy    Referral Priority:    Routine    Referral Type:   Physical Medicine    Referral Reason:   Specialty Services Required    Requested Specialty:   Physical Therapy   Meds ordered this encounter  Medications  . mupirocin ointment (BACTROBAN) 2 %    Sig: Apply to affected area TID for 7 days.    Dispense:  30 g    Refill:  3    Discussed warning signs or symptoms. Please see discharge instructions. Patient expresses understanding.

## 2017-10-25 ENCOUNTER — Encounter: Payer: Self-pay | Admitting: Rehabilitative and Restorative Service Providers"

## 2017-10-25 ENCOUNTER — Ambulatory Visit: Payer: Medicare Other | Admitting: Rehabilitative and Restorative Service Providers"

## 2017-10-25 DIAGNOSIS — M533 Sacrococcygeal disorders, not elsewhere classified: Secondary | ICD-10-CM | POA: Diagnosis not present

## 2017-10-25 DIAGNOSIS — R29898 Other symptoms and signs involving the musculoskeletal system: Secondary | ICD-10-CM | POA: Diagnosis not present

## 2017-10-25 DIAGNOSIS — R293 Abnormal posture: Secondary | ICD-10-CM | POA: Diagnosis not present

## 2017-10-25 NOTE — Therapy (Signed)
[facility] [place] [address] [place], [place], [address] Phone: [phone]   Fax:  [phone]  Physical Therapy Evaluation  Patient Details  Name: Brianna Daniel MRN: [id number] Date of Birth: 14-Jan-1951 Referring Provider: Dr Clementeen Graham    Encounter Date: 10/25/2017  PT End of Session - 10/25/17 1111    Visit Number  1    Number of Visits  6    Date for PT Re-Evaluation  12/06/17    PT Start Time  1014    PT Stop Time  1100    PT Time Calculation (min)  46 min    Activity Tolerance  Patient tolerated treatment well       Past Medical History:  Diagnosis Date  . Allergy   . Anxiety   . Asthma    exercise induced  . Cataract   . Osteopenia     Past Surgical History:  Procedure Laterality Date  . ABDOMINAL HYSTERECTOMY    . BLADDER SURGERY     2001  . MASS EXCISION Right 08/21/2013   Procedure: RIGHT WRIST MASS EXCISION/ARTHROTOMY SYNOVECTOMY WITH JOINT DEBRIDEMENT;  Surgeon: Dominica Severin, MD;  Location: [facility];  Service: Orthopedics;  Laterality: Right;  . SYNOVECTOMY Right 08/21/2013   Procedure: SYNOVECTOMY;  Surgeon: Dominica Severin, MD;  Location: [facility];  Service: Orthopedics;  Laterality: Right;  . TUBAL LIGATION      There were no vitals filed for this visit.   Subjective Assessment - 10/25/17 1018    Subjective  Patient reports that she fell backwards off step stool ~ 2-3 feet high and fell landing on sacrum 10/10/17. she felt immediate pain which has persisted. She has treated pain with medication and ice with some improvement.     Pertinent History  Patient fell 04/17/17 injuring the the SI joint Rt side treated with injection and medication with improvement; HTN    Diagnostic tests  xrays     Patient Stated Goals  get rid of the pain and continue to improve     Currently in Pain?  Yes    Pain Location  Coccyx    Pain Orientation  Posterior    Pain Descriptors  / Indicators  Burning    Pain Type  Acute pain    Pain Radiating Towards  sacrum     Pain Onset  1 to 4 weeks ago    Pain Frequency  Intermittent    Aggravating Factors   getting in and out of the car; sitting    Pain Relieving Factors  meds; lying on Lt side; lying in recliner; ice          [facility] PT Assessment - 10/25/17 0001      Assessment   Medical Diagnosis  Coccydynia     Referring Provider  Dr Clementeen Graham     Onset Date/Surgical Date  10/10/17    Hand Dominance  Right    Next MD Visit  11/12/17    Prior Therapy  for hip x 1 visit       Precautions   Precautions  None      Balance Screen   Has the patient fallen in the past 6 months  Yes    How many times?  2    Has the patient had a decrease in activity level because of a fear of falling?   No    Is the patient reluctant to leave their home because of a fear  of falling?   No      Home Environment   Additional Comments  single level home steps to enter       Prior Function   Level of Independence  Independent    Vocation  Retired    Film/video editorVocation Requirements  adm assisant     Leisure  household chores; water exercise; yard work       Observation/Other Assessments   Focus on Therapeutic Outcomes (FOTO)   57% limitation       Sensation   Additional Comments  WFL's per pt report       Posture/Postural Control   Posture Comments  sits shifted to Lt; stands with trund flexed forward       AROM   Overall AROM Comments  limited hip mobility     Lumbar Flexion  75% pulling    Lumbar Extension  50%     Lumbar - Right Side Bend  75%    Lumbar - Left Side Bend  75%    Lumbar - Right Rotation  60%    Lumbar - Left Rotation  60%      Strength   Overall Strength Comments  WFL not tested resistively       Flexibility   Hamstrings  60 deg bilat     Quadriceps  WFL's     Piriformis  tight bilat       Palpation   SI assessment   tender to palpation     Palpation comment  tender to palpatioin through the lumbosacral  spine and muscular tissue through the low back and hips into the coccyx       Ambulation/Gait   Gait Comments  ambulates with limp Rt LE forward flexed posture                 Objective measurements completed on examination: See above findings.      [facility] Adult PT Treatment/Exercise - 10/25/17 0001      Self-Care   Self-Care  --   initiated back care education      Exercises   Exercises  --   see HEP (+) 3 part core 10 sec x 10      Moist Heat Therapy   Number Minutes Moist Heat  15 Minutes    Moist Heat Location  Lumbar Spine   sacrum     Electrical Stimulation   Electrical Stimulation Location  bilat SI to buttocks     Electrical Stimulation Action  TENS    Electrical Stimulation Parameters  to tolerance     Electrical Stimulation Goals  Pain;Tone             PT Education - 10/25/17 1058    Education Details  HEP TENS back care     Person(s) Educated  Patient    Methods  Explanation;Demonstration;Tactile cues;Verbal cues;Handout    Comprehension  Verbalized understanding;Returned demonstration;Verbal cues required;Tactile cues required          PT Long Term Goals - 10/25/17 1118      PT LONG TERM GOAL #1   Title  Instruct in initial HEP and TENS unit 10/25/17     Time  1    Period  Days    Status  Achieved      PT LONG TERM GOAL #2   Title  Further assessment as indicated 12/06/17     Time  6    Period  Weeks    Status  New  Plan - 10/25/17 1112    Clinical Impression Statement  Brianna Daniel presents with coccyx and sacral pain following fall 10/10/17. She has gradually resolving pain but has continued pain limiting ambulation and ADL activities. She has limited trunk and LE mobility; ROM; pain with palpation through the coccyx and lumbosacral spine. Patient is on a fixed income and does not want to come for continued therapy. She will call with any questions about HEP or activity level     Clinical Presentation  Evolving     Clinical Decision Making  Low    Rehab Potential  Good    PT Frequency  1x / week    PT Duration  6 weeks    PT Treatment/Interventions  Patient/family education;ADLs/Self Care Home Management;Cryotherapy;Electrical Stimulation;Iontophoresis 4mg /ml Dexamethasone;Moist Heat;Ultrasound;Dry needling;Manual techniques;Neuromuscular re-education;Therapeutic activities;Therapeutic exercise    PT Next Visit Plan  patient will call with any questions about exercises or TENS unit     Consulted and Agree with Plan of Care  Patient       Patient will benefit from skilled therapeutic intervention in order to improve the following deficits and impairments:  Postural dysfunction, Improper body mechanics, Pain, Increased fascial restricitons, Increased muscle spasms, Decreased mobility, Decreased range of motion, Decreased activity tolerance  Visit Diagnosis: Coccyx pain - Plan: PT plan of care cert/re-cert  Other symptoms and signs involving the musculoskeletal system - Plan: PT plan of care cert/re-cert  Abnormal posture - Plan: PT plan of care cert/re-cert     Problem List Patient Active Problem List   Diagnosis Date Noted  . Class 1 obesity with serious comorbidity and body mass index (BMI) of 32.0 to 32.9 in adult 09/16/2017  . Dryness of eye 09/16/2017  . Sacroiliitis ([facility]) 05/30/2017  . History of adenomatous polyp of colon 06/01/2016  . Benign essential hypertension 04/23/2016  . Mixed hyperlipidemia 04/23/2016  . Combined form of senile cataract 12/23/2014    Brianna Daniel Rober Minion PT, MPH  10/25/2017, 11:22 AM  [facility] [address] [place], [place], [address] Phone: [phone]   Fax:  [phone]  Name: Brianna Daniel MRN: [id number] Date of Birth: 1950-04-30

## 2017-10-25 NOTE — Patient Instructions (Signed)
Gluteal Sets    Tighten buttocks while pressing pelvis to floor. Hold _10___ seconds. Repeat __10__ times per set. Do _1-2___ sets __2_ sessions per day.  Prop on forearms or place pillow under chest - stay for about one minute as tolerated   HIP: Hamstrings - Supine  Place strap around foot. Raise leg up, keeping knee straight.  Bend opposite knee to protect back if indicated. Hold 30 seconds. 3 reps per set, 2-3 sets per day   Piriformis Stretch   Lying on back, pull right knee toward opposite shoulder. Hold 30 seconds. Repeat 3 times. Do 2-3 sessions per day.  TENS UNIT: This is helpful for muscle pain and spasm.   Search and Purchase a TENS 7000 2nd edition at www.tenspros.com. It should be less than $30.     TENS unit instructions: Do not shower or bathe with the unit on Turn the unit off before removing electrodes or batteries If the electrodes lose stickiness add a drop of water to the electrodes after they are disconnected from the unit and place on plastic sheet. If you continued to have difficulty, call the TENS unit company to purchase more electrodes. Do not apply lotion on the skin area prior to use. Make sure the skin is clean and dry as this will help prolong the life of the electrodes. After use, always check skin for unusual red areas, rash or other skin difficulties. If there are any skin problems, does not apply electrodes to the same area. Never remove the electrodes from the unit by pulling the wires. Do not use the TENS unit or electrodes other than as directed. Do not change electrode placement without consultating your therapist or physician. Keep 2 fingers with between each electrode.   Sleeping on Back  Place pillow under knees. A pillow with cervical support and a roll around waist are also helpful. Copyright  VHI. All rights reserved.  Sleeping on Side Place pillow between knees. Use cervical support under neck and a roll around waist as  needed. Copyright  VHI. All rights reserved.   Sleeping on Stomach   If this is the only desirable sleeping position, place pillow under lower legs, and under stomach or chest as needed.  Posture - Sitting   Sit upright, head facing forward. Try using a roll to support lower back. Keep shoulders relaxed, and avoid rounded back. Keep hips level with knees. Avoid crossing legs for long periods. Stand to Sit / Sit to Stand   To sit: Bend knees to lower self onto front edge of chair, then scoot back on seat. To stand: Reverse sequence by placing one foot forward, and scoot to front of seat. Use rocking motion to stand up.   Work Height and Reach  Ideal work height is no more than 2 to 4 inches below elbow level when standing, and at elbow level when sitting. Reaching should be limited to arm's length, with elbows slightly bent.  Bending  Bend at hips and knees, not back. Keep feet shoulder-width apart.    Posture - Standing   Good posture is important. Avoid slouching and forward head thrust. Maintain curve in low back and align ears over shoul- ders, hips over ankles.  Alternating Positions   Alternate tasks and change positions frequently to reduce fatigue and muscle tension. Take rest breaks. Computer Work   Position work to Art gallery manager. Use proper work and seat height. Keep shoulders back and down, wrists straight, and elbows at right angles. Use  chair that provides full back support. Add footrest and lumbar roll as needed.  Getting Into / Out of Car  Lower self onto seat, scoot back, then bring in one leg at a time. Reverse sequence to get out.  Dressing  Lie on back to pull socks or slacks over feet, or sit and bend leg while keeping back straight.    Housework - Sink  Place one foot on ledge of cabinet under sink when standing at sink for prolonged periods.   Pushing / Pulling  Pushing is preferable to pulling. Keep back in proper alignment, and use leg  muscles to do the work.  Deep Squat   Squat and lift with both arms held against upper trunk. Tighten stomach muscles without holding breath. Use smooth movements to avoid jerking.  Avoid Twisting   Avoid twisting or bending back. Pivot around using foot movements, and bend at knees if needed when reaching for articles.  Carrying Luggage   Distribute weight evenly on both sides. Use a cart whenever possible. Do not twist trunk. Move body as a unit.   Lifting Principles .Maintain proper posture and head alignment. .Slide object as close as possible before lifting. .Move obstacles out of the way. .Test before lifting; ask for help if too heavy. .Tighten stomach muscles without holding breath. .Use smooth movements; do not jerk. .Use legs to do the work, and pivot with feet. .Distribute the work load symmetrically and close to the center of trunk. .Push instead of pull whenever possible.   Ask For Help   Ask for help and delegate to others when possible. Coordinate your movements when lifting together, and maintain the low back curve.  Log Roll   Lying on back, bend left knee and place left arm across chest. Roll all in one movement to the right. Reverse to roll to the left. Always move as one unit. Housework - Sweeping  Use long-handled equipment to avoid stooping.   Housework - Wiping  Position yourself as close as possible to reach work surface. Avoid straining your back.  Laundry - Unloading Wash   To unload small items at bottom of washer, lift leg opposite to arm being used to reach.  Gardening - Raking  Move close to area to be raked. Use arm movements to do the work. Keep back straight and avoid twisting.     Cart  When reaching into cart with one arm, lift opposite leg to keep back straight.   Getting Into / Out of Bed  Lower self to lie down on one side by raising legs and lowering head at the same time. Use arms to assist moving without twisting.  Bend both knees to roll onto back if desired. To sit up, start from lying on side, and use same move-ments in reverse. Housework - Vacuuming  Hold the vacuum with arm held at side. Step back and forth to move it, keeping head up. Avoid twisting.   Laundry - Armed forces training and education officerLoading Wash  Position laundry basket so that bending and twisting can be avoided.   Laundry - Unloading Dryer  Squat down to reach into clothes dryer or use a reacher.  Gardening - Weeding / Psychiatric nurselanting  Squat or Kneel. Knee pads may be helpful.

## 2017-11-12 ENCOUNTER — Encounter: Payer: Self-pay | Admitting: Family Medicine

## 2017-11-12 ENCOUNTER — Ambulatory Visit: Payer: Medicare Other | Admitting: Family Medicine

## 2017-11-12 VITALS — BP 123/77 | HR 91 | Ht 61.0 in | Wt 164.0 lb

## 2017-11-12 DIAGNOSIS — S39012D Strain of muscle, fascia and tendon of lower back, subsequent encounter: Secondary | ICD-10-CM | POA: Diagnosis not present

## 2017-11-12 DIAGNOSIS — M533 Sacrococcygeal disorders, not elsewhere classified: Secondary | ICD-10-CM

## 2017-11-12 NOTE — Patient Instructions (Addendum)
Thank you for coming in today. Continue home exercises Recheck with me as needed.  If not back to baseline in 6 weeks recheck and consider MRI and injection.   Make sure to follow up with your doctor to get the flu shot.

## 2017-11-12 NOTE — Progress Notes (Signed)
Brianna Daniel is a 67 y.o. female who presents to [facility] today for follow-up low back pain and coccydynia.  Patient was seen on September 10 for coccydynia due to a contusion.  Additionally the time she had low back strain as well.  She is had one episode of physical therapy.  She notes she feels a lot better.  Her coccyx pain is completely resolved but she continues to note mild low back pain.  She is quite satisfied with how things are going.  No radiating pain weakness or numbness.  She notes she is scheduled to follow-up with her primary care provider later this month.    ROS:  As above  Exam:  BP 123/77   Pulse 91   Ht 5\' 1"  (1.549 m)   Wt 164 lb (74.4 kg)   BMI 30.99 kg/m  General: Well Developed, well nourished, and in no acute distress.  Neuro/Psych: Alert and oriented x3, extra-ocular muscles intact, able to move all 4 extremities, sensation grossly intact. Skin: Warm and dry, no rashes noted.  Respiratory: Not using accessory muscles, speaking in full sentences, trachea midline.  Cardiovascular: Pulses palpable, no extremity edema. Abdomen: Does not appear distended. MSK: L-spine: Decreased motion due to pain.  Normal gait.    Assessment and Plan: 67 y.o. female with coccydynia resolved.  Watchful waiting recheck as needed.  Low back pain likely myofascial disruption.  Plan for continued home exercise program and physical therapy.  Recheck as needed.  Return sooner if needed.  If not better in 6 weeks consider return to clinic for potential MRI for epidural or facet injection planning.    No orders of the defined types were placed in this encounter.  No orders of the defined types were placed in this encounter.   Historical information moved to improve visibility of documentation.  Past Medical History:  Diagnosis Date  . Allergy   . Anxiety   . Asthma    exercise induced  . Cataract   . Osteopenia     Past Surgical History:  Procedure Laterality Date  . ABDOMINAL HYSTERECTOMY    . BLADDER SURGERY     2001  . MASS EXCISION Right 08/21/2013   Procedure: RIGHT WRIST MASS EXCISION/ARTHROTOMY SYNOVECTOMY WITH JOINT DEBRIDEMENT;  Surgeon: Dominica Severin, MD;  Location: [facility];  Service: Orthopedics;  Laterality: Right;  . SYNOVECTOMY Right 08/21/2013   Procedure: SYNOVECTOMY;  Surgeon: Dominica Severin, MD;  Location: [facility];  Service: Orthopedics;  Laterality: Right;  . TUBAL LIGATION     Social History   Tobacco Use  . Smoking status: Never Smoker  . Smokeless tobacco: Never Used  Substance Use Topics  . Alcohol use: No   family history includes Heart disease in her brother; Hyperlipidemia in her sister and sister.  Medications: Current Outpatient Medications  Medication Sig Dispense Refill  . aspirin 81 MG EC tablet Take by mouth.    . Baclofen 5 MG TABS Take 1 tablet by mouth 3 (three) times daily. 20 tablet 1  . citalopram (CELEXA) 40 MG tablet Take 40 mg by mouth daily. Reported on 03/16/2015    . HYDROcodone-acetaminophen (NORCO/VICODIN) 5-325 MG tablet Take one by mouth at bedtime as needed for pain.  May repeat in 6 hours 10 tablet 0  . ipratropium (ATROVENT) 0.03 % nasal spray Place 2 sprays into both nostrils 2 (two) times daily. 30 mL 0  . losartan (COZAAR) 50 MG tablet TAKE  1 TABLET BY MOUTH  EVERY DAY    . meloxicam (MOBIC) 15 MG tablet Take 1 tablet (15 mg total) by mouth daily. Take with food each morning 15 tablet 0  . mupirocin ointment (BACTROBAN) 2 % Apply to affected area TID for 7 days. 30 g 3  . simvastatin (ZOCOR) 10 MG tablet Take 10 mg by mouth daily.    Marland Kitchen VIGAMOX 0.5 % ophthalmic solution INSTILL 1 DROP INTO THE AFFECTED EYE FOUR TIMES A DAY STARTING 3 DAYS PRIOR TO SURGERY  0  . nitroGLYCERIN (NITROSTAT) 0.4 MG SL tablet Place under the tongue.     No current facility-administered medications for this visit.     Allergies  Allergen Reactions  . Olanzapine Swelling  . Penicillins Hives      Discussed warning signs or symptoms. Please see discharge instructions. Patient expresses understanding.

## 2017-11-27 NOTE — Therapy (Addendum)
[place], [place], [address] Phone: [phone]   Fax:  [phone]  Physical Therapy Discharge  Patient Details  Name: Dannia Snook MRN: [id number] Date of Birth: [birth year] Referring Provider (PT): Dr Lynne Leader    Encounter Date: 10/25/2017    Past Medical History:  Diagnosis Date  . Allergy   . Anxiety   . Asthma    exercise induced  . Cataract   . Osteopenia     Past Surgical History:  Procedure Laterality Date  . ABDOMINAL HYSTERECTOMY    . BLADDER SURGERY     2001  . MASS EXCISION Right 08/21/2013   Procedure: RIGHT WRIST MASS EXCISION/ARTHROTOMY SYNOVECTOMY WITH JOINT DEBRIDEMENT;  Surgeon: Roseanne Kaufman, MD;  Location: [place];  Service: Orthopedics;  Laterality: Right;  . SYNOVECTOMY Right 08/21/2013   Procedure: SYNOVECTOMY;  Surgeon: Roseanne Kaufman, MD;  Location: [place];  Service: Orthopedics;  Laterality: Right;  . TUBAL LIGATION      There were no vitals filed for this visit.                                 PT Long Term Goals - 10/25/17 1118      PT LONG TERM GOAL #1   Title  Instruct in initial HEP and TENS unit 10/25/17     Time  1    Period  Days    Status  Achieved      PT LONG TERM GOAL #2   Title  Further assessment as indicated 12/06/17     Time  6    Period  Weeks    Status  New              Patient will benefit from skilled therapeutic intervention in order to improve the following deficits and impairments:  Postural dysfunction, Improper body mechanics, Pain, Increased fascial restricitons, Increased muscle spasms, Decreased mobility, Decreased range of motion, Decreased activity tolerance  Visit Diagnosis: Coccyx pain - Plan: PT plan of care cert/re-cert  Other symptoms and signs involving the musculoskeletal system - Plan: PT plan of care cert/re-cert  Abnormal posture -  Plan: PT plan of care cert/re-cert     Problem List Patient Active Problem List   Diagnosis Date Noted  . Class 1 obesity with serious comorbidity and body mass index (BMI) of 32.0 to 32.9 in adult 09/16/2017  . Dryness of eye 09/16/2017  . Sacroiliitis ([place]) 05/30/2017  . History of adenomatous polyp of colon 06/01/2016  . Benign essential hypertension 04/23/2016  . Mixed hyperlipidemia 04/23/2016  . Combined form of senile cataract 12/23/2014    Victorya Hillman P Helene Kelp 11/27/2017, 10:47 AM  [facility] [place], [place], [address] Phone: [phone]   Fax:  [phone]  Name: Seleta Hovland MRN: [id number] Date of Birth: December 08, 1950  PHYSICAL THERAPY DISCHARGE SUMMARY  Visits from Start of Care: Evaluation Only   Current functional level related to goals / functional outcomes: See evaluation    Remaining deficits: Unknown    Education / Equipment: HEP  Plan: Patient agrees to discharge.  Patient goals were not met. Patient is being discharged due to being pleased with the current functional level.  ?????    Deboraha Goar P. Helene Kelp PT, MPH 11/27/17 10:48 AM

## 2023-05-02 ENCOUNTER — Encounter: Payer: Self-pay | Admitting: Podiatry

## 2023-05-02 ENCOUNTER — Ambulatory Visit

## 2023-05-02 ENCOUNTER — Other Ambulatory Visit: Payer: Self-pay

## 2023-05-02 ENCOUNTER — Ambulatory Visit: Admitting: Podiatry

## 2023-05-02 DIAGNOSIS — M21619 Bunion of unspecified foot: Secondary | ICD-10-CM

## 2023-05-02 DIAGNOSIS — M67471 Ganglion, right ankle and foot: Secondary | ICD-10-CM

## 2023-05-02 DIAGNOSIS — M21611 Bunion of right foot: Secondary | ICD-10-CM

## 2023-05-02 NOTE — Progress Notes (Signed)
  Subjective:  Patient ID: Brianna Daniel, female    DOB: Jul 23, 1950,   MRN: [id number]  Chief Complaint  Patient presents with   Bunions    Pt presents for a bunion great toe On the right, stated that she had it for a while and now is starting to hurt.    73 y.o. female presents for concern of bunion that she has had for years. She relates recently it has started to become painful in the last month. Denies any injuries or changes. She does have a history of ganglion cyst on her wrist. Has been limited in her walking because of this.  . Denies any other pedal complaints. Denies n/v/f/c.   Past Medical History:  Diagnosis Date   Allergy    Anxiety    Asthma    exercise induced   Cataract    Osteopenia     Objective:  Physical Exam: Vascular: DP/PT pulses 2/4 bilateral. CFT <3 seconds. Normal hair growth on digits. No edema.  Skin. No lacerations or abrasions bilateral feet.  Musculoskeletal: MMT 5/5 bilateral lower extremities in DF, PF, Inversion and Eversion. Deceased ROM in DF of ankle joint. Mild HAV deformity noted with palpable mass noted to medial eminence with fluctuance and tenderness to palpabtion not mobil. No pain with Rom of the first MPJ and no pain dorsally to the joint or plantarly  Neurological: Sensation intact to light touch.   Assessment:   1. Ganglion cyst of right foot   2. Bunion, right      Plan:  Patient was evaluated and treated and all questions answered. -Xrays reviewed . 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 No cystic changes and no major osteophytes.  -Discussed HAV and treatment options;conservative and surgical management; risks, benefits, alternatives discussed. All patient's questions answered. Discussed ganglion cysts and treatment options with the patient. Patient elected to go aspiration of the cyst today.  Procedure note below. Advised patient on postprocedure  protocol. Patient to follow-up in 2 weeks or sooner if symptoms worsen or fail to improve.    Procedure: Aspiration cyst, Right medial first metatarsal  Discussed alternatives, risks, complications and verbal consent was obtained.  Location: right medial first metatarsal head Skin Prep: Alcohol. Injectate: 3 cc 1 % lidocaine.  Aspirated cyst with 18 gauge needle, about 2 cc of gel like viscous fluid aspirated consistent with ganglion cyst Disposition: Patient tolerated procedure well. Injection site dressed with a band-aid. Compression bandage applied.  Post-procedure care was discussed and return precautions discussed.      Louann Sjogren, DPM

## 2023-05-16 ENCOUNTER — Ambulatory Visit: Admitting: Podiatry

## 2024-01-27 ENCOUNTER — Encounter: Payer: Self-pay | Admitting: Podiatry

## 2024-02-17 ENCOUNTER — Ambulatory Visit: Admitting: Podiatry

## 2024-02-17 ENCOUNTER — Encounter: Payer: Self-pay | Admitting: Podiatry

## 2024-02-17 DIAGNOSIS — M21611 Bunion of right foot: Secondary | ICD-10-CM | POA: Diagnosis not present

## 2024-02-17 DIAGNOSIS — B351 Tinea unguium: Secondary | ICD-10-CM | POA: Diagnosis not present

## 2024-02-17 NOTE — Progress Notes (Signed)
 "  Subjective:  Patient ID: Brianna Daniel, female    DOB: 08/14/1950,   MRN: [id number]  Chief Complaint  Patient presents with   Bunions    The bunion has started to bother me. (Right)   Nail Problem    I have fungus in my toenails.    74 y.o. female presents for follow-up of continued pain in the bunion area.  She does relate that the ganglion cyst has not come back but she is still getting pain in the bunion area.  She relates she has tried different shoes and it is continuing to cause her problems and affecting her daily life.  She also relates some concern of the left great toe and second toe nail changes.. Denies any other pedal complaints. Denies n/v/f/c.   Past Medical History:  Diagnosis Date   Allergy    Anxiety    Asthma    exercise induced   Cataract    Osteopenia     Objective:  Physical Exam: Vascular: DP/PT pulses 2/4 bilateral. CFT <3 seconds. Normal hair growth on digits. No edema.  Skin. No lacerations or abrasions bilateral feet.  Left second and hallux nail thickened dystrophic and discolored with subungual debris. Musculoskeletal: MMT 5/5 bilateral lower extremities in DF, PF, Inversion and Eversion. Deceased ROM in DF of ankle joint. 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  There is still tenderness of the medial eminence.  No hypermobility noted at the first ray.  No pain with Rom of the first MPJ and no pain dorsally to the joint or plantarly  Neurological: Sensation intact to light touch.   Assessment:   1. Bunion, right   2. Onychomycosis       Plan:  Patient was evaluated and treated and all questions answered. -Xrays reviewed . 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 No cystic changes and no major osteophytes.  -Discussed HAV and treatment options;conservative and surgical management; risks, benefits, alternatives discussed. All patient's questions  answered. And shoe gear modification. Discussed patient has exhausted conservative treatments and would like to discuss surgical options.  Discussed Massie edwards with patient and perioperative course.  Discussed weightbearing minimally in boot for 6 weeks following surgery. -Informed surgical risk consent was reviewed and read aloud to the patient.  I reviewed the films.  I have discussed my findings with the patient in great detail.  I have discussed all risks including but not limited to infection, stiffness, scarring, limp, disability, deformity, damage to blood vessels and nerves, numbness, poor healing, need for braces, arthritis, chronic pain, amputation, death.  All benefits and realistic expectations discussed in great detail.  I have made no promises as to the outcome.  I have provided realistic expectations.  I have offered the patient a 2nd opinion, which they have declined and assured me they preferred to proceed despite the risks. Plan for surgery sometime in late February. Postop meds: Zofran , oxycodone  5-325 mg, clindamycin   -Examined patient -Discussed treatment options for painful dystrophic nails  -Clinical picture and Fungal culture was obtained by removing a portion of the hard nail itself from each of the involved toenails using a sterile nail nipper and sent to [facility] lab. Patient tolerated the biopsy procedure well without discomfort or need for anesthesia.  -Discussed fungal nail treatment options including oral, topical, and laser treatments.  -Patient to return in 4 weeks for follow up evaluation and discussion of fungal  culture results or sooner if symptoms worsen.   Asberry Failing, DPM    "

## 2024-02-21 ENCOUNTER — Other Ambulatory Visit: Payer: Self-pay | Admitting: Podiatry

## 2024-03-04 ENCOUNTER — Telehealth: Payer: Self-pay | Admitting: Podiatry

## 2024-03-04 NOTE — Telephone Encounter (Signed)
 Called to schedule patients surgery- unable to leave message voicemail not set up

## 2024-03-16 ENCOUNTER — Ambulatory Visit: Admitting: Podiatry

## 2024-03-18 NOTE — Telephone Encounter (Signed)
 Called and left message for patient to contact office to schedule surgery.

## 2024-03-19 ENCOUNTER — Encounter: Payer: Self-pay | Admitting: Podiatry

## 2024-03-19 ENCOUNTER — Telehealth: Payer: Self-pay | Admitting: Podiatry

## 2024-03-19 NOTE — Telephone Encounter (Signed)
 Called and scheduled patient for surgery on 04/28/2024. Patient not on any GLP1 or blood thinners. Patient pharmacy correct in chart.

## 2024-03-20 ENCOUNTER — Other Ambulatory Visit: Payer: Self-pay | Admitting: Podiatry

## 2024-03-23 ENCOUNTER — Ambulatory Visit: Admitting: Podiatry

## 2024-05-08 ENCOUNTER — Encounter: Admitting: Podiatry

## 2024-05-29 ENCOUNTER — Encounter: Admitting: Podiatry
